# Patient Record
Sex: Female | Born: 1950 | Race: White | Hispanic: No | Marital: Married
Health system: Midwestern US, Academic
[De-identification: ages and names within clinical notes are randomized; demographics above are authoritative.]

---

## 2016-12-08 ENCOUNTER — Encounter: Admit: 2016-12-08 | Discharge: 2016-12-08 | Payer: MEDICARE

## 2016-12-08 DIAGNOSIS — Z780 Asymptomatic menopausal state: ICD-10-CM

## 2016-12-08 DIAGNOSIS — E669 Obesity, unspecified: ICD-10-CM

## 2016-12-08 DIAGNOSIS — C50411 Malignant neoplasm of upper-outer quadrant of right female breast: ICD-10-CM

## 2016-12-08 DIAGNOSIS — Z853 Personal history of malignant neoplasm of breast: ICD-10-CM

## 2016-12-08 DIAGNOSIS — R7611 Nonspecific reaction to tuberculin skin test without active tuberculosis: ICD-10-CM

## 2016-12-08 DIAGNOSIS — Z1231 Encounter for screening mammogram for malignant neoplasm of breast: Principal | ICD-10-CM

## 2016-12-08 DIAGNOSIS — Z86718 Personal history of other venous thrombosis and embolism: ICD-10-CM

## 2016-12-08 DIAGNOSIS — E785 Hyperlipidemia, unspecified: ICD-10-CM

## 2016-12-08 DIAGNOSIS — M1990 Unspecified osteoarthritis, unspecified site: Principal | ICD-10-CM

## 2016-12-08 DIAGNOSIS — F419 Anxiety disorder, unspecified: ICD-10-CM

## 2016-12-08 DIAGNOSIS — Z7901 Long term (current) use of anticoagulants: ICD-10-CM

## 2016-12-08 DIAGNOSIS — Z9221 Personal history of antineoplastic chemotherapy: ICD-10-CM

## 2016-12-08 DIAGNOSIS — Z13820 Encounter for screening for osteoporosis: ICD-10-CM

## 2016-12-08 DIAGNOSIS — C779 Secondary and unspecified malignant neoplasm of lymph node, unspecified: ICD-10-CM

## 2016-12-08 DIAGNOSIS — Z923 Personal history of irradiation: ICD-10-CM

## 2016-12-08 DIAGNOSIS — Z170 Estrogen receptor positive status [ER+]: ICD-10-CM

## 2016-12-08 DIAGNOSIS — Z79811 Long term (current) use of aromatase inhibitors: ICD-10-CM

## 2016-12-08 DIAGNOSIS — M8580 Other specified disorders of bone density and structure, unspecified site: ICD-10-CM

## 2016-12-08 DIAGNOSIS — Z9189 Other specified personal risk factors, not elsewhere classified: ICD-10-CM

## 2016-12-08 DIAGNOSIS — R5383 Other fatigue: ICD-10-CM

## 2016-12-08 DIAGNOSIS — Z9011 Acquired absence of right breast and nipple: ICD-10-CM

## 2016-12-08 DIAGNOSIS — I10 Essential (primary) hypertension: ICD-10-CM

## 2016-12-08 DIAGNOSIS — H409 Unspecified glaucoma: ICD-10-CM

## 2016-12-08 LAB — CBC AND DIFF
Lab: 0.1 10*3/uL (ref 0–0.20)
Lab: 4.4 M/UL (ref 4.0–5.0)
Lab: 7.4 K/UL (ref 4.5–11.0)

## 2016-12-08 LAB — COMPREHENSIVE METABOLIC PANEL
Lab: 0.7 mg/dL (ref 0.4–1.00)
Lab: 102 MMOL/L (ref 98–110)
Lab: 137 MMOL/L (ref 137–147)
Lab: 15 mg/dL (ref >60)
Lab: 17 U/L (ref 7–56)
Lab: 29 MMOL/L (ref 21–30)
Lab: 3.7 MMOL/L — ABNORMAL HIGH (ref 3.5–5.1)
Lab: 6 K/UL (ref 3–12)
Lab: 9.8 mg/dL (ref 8.5–10.6)
Lab: 99 mg/dL (ref >60)
Lab: >60 mL/min (ref >60)
Lab: >60 mL/min (ref >60)

## 2016-12-09 ENCOUNTER — Encounter: Admit: 2016-12-09 | Discharge: 2016-12-09 | Payer: MEDICARE

## 2016-12-09 NOTE — Progress Notes
Patient called back to schedule appointment with Kasandra Knudsen, APRN 02/20/17 9:00 for genetic testing.   Paperwork mailed.

## 2017-05-10 ENCOUNTER — Encounter: Admit: 2017-05-10 | Discharge: 2017-05-10 | Payer: MEDICARE

## 2017-05-10 DIAGNOSIS — F419 Anxiety disorder, unspecified: ICD-10-CM

## 2017-05-10 DIAGNOSIS — H409 Unspecified glaucoma: ICD-10-CM

## 2017-05-10 DIAGNOSIS — Z853 Personal history of malignant neoplasm of breast: ICD-10-CM

## 2017-05-10 DIAGNOSIS — R7611 Nonspecific reaction to tuberculin skin test without active tuberculosis: ICD-10-CM

## 2017-05-10 DIAGNOSIS — Z8041 Family history of malignant neoplasm of ovary: ICD-10-CM

## 2017-05-10 DIAGNOSIS — E785 Hyperlipidemia, unspecified: ICD-10-CM

## 2017-05-10 DIAGNOSIS — R5383 Other fatigue: ICD-10-CM

## 2017-05-10 DIAGNOSIS — M1990 Unspecified osteoarthritis, unspecified site: Principal | ICD-10-CM

## 2017-05-10 DIAGNOSIS — Z1379 Encounter for other screening for genetic and chromosomal anomalies: Principal | ICD-10-CM

## 2017-05-10 DIAGNOSIS — I10 Essential (primary) hypertension: ICD-10-CM

## 2017-05-20 ENCOUNTER — Encounter: Admit: 2017-05-20 | Discharge: 2017-05-20 | Payer: MEDICARE

## 2017-05-23 ENCOUNTER — Encounter: Admit: 2017-05-23 | Discharge: 2017-05-23 | Payer: MEDICARE

## 2017-05-23 DIAGNOSIS — Z1379 Encounter for other screening for genetic and chromosomal anomalies: ICD-10-CM

## 2017-05-23 DIAGNOSIS — H409 Unspecified glaucoma: ICD-10-CM

## 2017-05-23 DIAGNOSIS — I10 Essential (primary) hypertension: ICD-10-CM

## 2017-05-23 DIAGNOSIS — E785 Hyperlipidemia, unspecified: ICD-10-CM

## 2017-05-23 DIAGNOSIS — R5383 Other fatigue: ICD-10-CM

## 2017-05-23 DIAGNOSIS — M1990 Unspecified osteoarthritis, unspecified site: Principal | ICD-10-CM

## 2017-05-23 DIAGNOSIS — Z853 Personal history of malignant neoplasm of breast: ICD-10-CM

## 2017-05-23 DIAGNOSIS — F419 Anxiety disorder, unspecified: ICD-10-CM

## 2017-05-23 DIAGNOSIS — R7611 Nonspecific reaction to tuberculin skin test without active tuberculosis: ICD-10-CM

## 2017-05-23 MED ORDER — LETROZOLE 2.5 MG PO TAB
ORAL_TABLET | Freq: Every day | ORAL | 3 refills | 16.50000 days | Status: AC
Start: 2017-05-23 — End: 2018-05-15

## 2017-05-25 ENCOUNTER — Encounter: Admit: 2017-05-25 | Discharge: 2017-05-25 | Payer: MEDICARE

## 2017-05-26 ENCOUNTER — Encounter: Admit: 2017-05-26 | Discharge: 2017-05-26 | Payer: MEDICARE

## 2017-05-26 DIAGNOSIS — Z803 Family history of malignant neoplasm of breast: Principal | ICD-10-CM

## 2017-12-14 ENCOUNTER — Encounter: Admit: 2017-12-14 | Discharge: 2017-12-14 | Payer: MEDICARE

## 2017-12-14 ENCOUNTER — Encounter: Admit: 2017-12-14 | Discharge: 2017-12-15 | Payer: MEDICARE

## 2017-12-14 DIAGNOSIS — Z853 Personal history of malignant neoplasm of breast: ICD-10-CM

## 2017-12-14 DIAGNOSIS — D126 Benign neoplasm of colon, unspecified: ICD-10-CM

## 2017-12-14 DIAGNOSIS — Z1211 Encounter for screening for malignant neoplasm of colon: ICD-10-CM

## 2017-12-14 DIAGNOSIS — Z86718 Personal history of other venous thrombosis and embolism: ICD-10-CM

## 2017-12-14 DIAGNOSIS — Z79811 Long term (current) use of aromatase inhibitors: ICD-10-CM

## 2017-12-14 DIAGNOSIS — M8580 Other specified disorders of bone density and structure, unspecified site: ICD-10-CM

## 2017-12-14 DIAGNOSIS — Z9011 Acquired absence of right breast and nipple: ICD-10-CM

## 2017-12-14 DIAGNOSIS — Z9221 Personal history of antineoplastic chemotherapy: ICD-10-CM

## 2017-12-14 DIAGNOSIS — Z1231 Encounter for screening mammogram for malignant neoplasm of breast: Principal | ICD-10-CM

## 2017-12-14 DIAGNOSIS — M5127 Other intervertebral disc displacement, lumbosacral region: ICD-10-CM

## 2017-12-14 DIAGNOSIS — Z923 Personal history of irradiation: ICD-10-CM

## 2017-12-14 DIAGNOSIS — C50411 Malignant neoplasm of upper-outer quadrant of right female breast: ICD-10-CM

## 2017-12-14 DIAGNOSIS — Z1379 Encounter for other screening for genetic and chromosomal anomalies: ICD-10-CM

## 2017-12-14 DIAGNOSIS — M1990 Unspecified osteoarthritis, unspecified site: Principal | ICD-10-CM

## 2017-12-14 DIAGNOSIS — Z13820 Encounter for screening for osteoporosis: ICD-10-CM

## 2017-12-14 DIAGNOSIS — Z170 Estrogen receptor positive status [ER+]: ICD-10-CM

## 2017-12-14 DIAGNOSIS — F419 Anxiety disorder, unspecified: ICD-10-CM

## 2017-12-14 DIAGNOSIS — I10 Essential (primary) hypertension: ICD-10-CM

## 2017-12-14 DIAGNOSIS — R7611 Nonspecific reaction to tuberculin skin test without active tuberculosis: ICD-10-CM

## 2017-12-14 DIAGNOSIS — R5383 Other fatigue: ICD-10-CM

## 2017-12-14 DIAGNOSIS — H409 Unspecified glaucoma: ICD-10-CM

## 2017-12-14 DIAGNOSIS — E785 Hyperlipidemia, unspecified: ICD-10-CM

## 2017-12-14 LAB — COMPREHENSIVE METABOLIC PANEL
Lab: 0.7 mg/dL (ref 0.4–1.00)
Lab: 10 mg/dL (ref 8.5–10.6)
Lab: 103 MMOL/L (ref 98–110)
Lab: 136 MMOL/L — ABNORMAL LOW (ref 137–147)
Lab: 20 mg/dL (ref 7–25)
Lab: 4.2 MMOL/L (ref 3.5–5.1)
Lab: 7 g/dL (ref 6.0–8.0)
Lab: 98 mg/dL (ref 70–100)

## 2017-12-14 LAB — CBC AND DIFF
Lab: 4.4 M/UL (ref 4.0–5.0)
Lab: 7.9 10*3/uL (ref 4.5–11.0)

## 2018-05-14 ENCOUNTER — Encounter: Admit: 2018-05-14 | Discharge: 2018-05-14 | Payer: MEDICARE

## 2018-05-15 MED ORDER — LETROZOLE 2.5 MG PO TAB
ORAL_TABLET | Freq: Every day | ORAL | 0 refills | 16.50000 days | Status: AC
Start: 2018-05-15 — End: 2018-08-21

## 2018-08-18 ENCOUNTER — Encounter: Admit: 2018-08-18 | Discharge: 2018-08-18 | Payer: MEDICARE

## 2018-08-21 MED ORDER — LETROZOLE 2.5 MG PO TAB
ORAL_TABLET | Freq: Every day | ORAL | 3 refills | 16.50000 days | Status: AC
Start: 2018-08-21 — End: ?

## 2018-12-19 NOTE — Progress Notes
Name: Tiffany Burton          MRN: [id number]      DOB: December 13, 1950      AGE: 68 y.o.   DATE OF SERVICE: 12/20/2018    Subjective:             Reason for Visit: Routine surveillance.     Breast Problem    Tiffany Burton is a 68 y.o. female     DIAGNOSIS: Right breast CA, 10/30/2008 (age 2)    STAGE: cT2N1M0, Stage IIB; ER+ PR+ HER-2/neu negative; Grade 3. Post neoadjuvant pathologic staging ypT1aN1(mic)M0    TREATMENT SUMMARY:  Ms. Strebe is a 68 year old postmenopausal female who underwent rountine screening mammogram on 10/21/2008 at [facility] which demonstrated a spiculated density in the UOQ of the right breast (BIRADS Category 0). Additional imaging was recommended and the patient underwent a diagnostic mammogram of the right breast with ultrasound which were read as BIRADS Category 4. These imaging studies revealed a 10 o'clock lesion in the right breast measuring 1.6 x 1.3 x 1.3cm. CNBX was performed on 10/27/2008 and demonstrated invasive ductal carcinoma, Grade 3, ER 100%, PR 22%, Ki-67 3%, HER-2/neu 0+ by IHC, p53 0%, EGFR 0%. She then sought an opinion with Dr. Aram Beecham at [facility] and repeat breast imaging was performed. Ultrasound on 11/10/2008 revealed an increase in the right breast mass 10 o'clock position 7cm FTN measuring 2.5 x 2.7 x 1.8cm. In addition there was a suspicious appearing right axillary lymph node identified measuring 0.9 x 1.8 x 0.7cm. Biopsy of the lymph node 11/10/2008 demonstrated metastatic carcinoma, consistent with ductal carcinoma of the breast with predictive markers of ER 99%, PR 15%, Ki-67 7%, HER-2/neu 0+ by IHC and FISH 1.0. MRI was also performed on 11/10/2008 and the right breast carcinoma measured 2.5 x 3.5 x 3.9 cm. The patient had a portacath placed by Dr. Fredricka Bonine 11/20/2008.   ?  Neoadjuvant chemotherapy was recommended; the patient completed Epirubicin/Cyclophosphamide x 4 cycles (11/20/2008 - 12/31/2008) and Taxotere 75mg /m2 every 3 weeks (01/14/2009- 03/18/2009).   She underwent right modified radical mastectomy 04/14/2009 Fredricka Bonine). Pathology showed residual  0.2cm, grade 2, IDC with ALND (6/15) positive lymph nodes.  Prognostic markers on mastectomy: ER 100%, PR 0%, HER-2 0+, Ki-67 35%. Prognostic markers on LN: ER 100%, PR 0%, HER-2 0+, Ki-67 1%. Right chest wall radiation (Dr. Joycelyn Man) completed 07/16/2009. She then started antihormone therapy with femara (06/2009) and received bisphosphonate therapy with Zometa 07/17/2009 and completed 2 years of therapy (09/07/2011).  Port removed on 06/15/10.  ?  PRESENT THERAPY:  Currently on letrozole (06/2009); will complete in 10 years (06/2019)  ?  Medical Team:   Surgeon: Aram Beecham, MD  [facility]  [address] [place], [place] [address]  [phone]  ?  Medical oncologist: Lowella Petties, MD  [facility]  [address] [place], [place] [address]  [phone]  ?  Radiation oncologist: Joycelyn Man, MD  [facility]  [address]  [place], [place] [address]  [phone]    Family History   Problem Relation Age of Onset   ? Dementia Father    ? COPD Father    ? Coronary Artery Disease Mother    ? Glaucoma Mother    ? Cancer-Pancreas Paternal Uncle 34   ? Cancer Paternal Grandmother  unknown primary-tumor between spine & esophagus   ? Cancer-Hematologic Paternal Grandmother         lymphoma   ? Cancer-Ovarian Paternal Aunt 32   ? Cancer-Lung Paternal Uncle    ? Cancer-Prostate Brother 58   ? None Reported Son    ? None Reported Daughter    ? None Reported Sister         Lives in [place]       GENETIC TESTING:   [facility] testing: Common Hereditary Cancers Panel 05/10/2017: VUS RAD50 c.1336A>G (p.Lys446Glu)    HISTORY OF PRESENT ILLNESS:   The patient returns to the clinic today for continued follow-up in the Breast Cancer Survivorship Clinic. She has a history of right breast CA, 10/30/2008 (57). Stage IIB; ER+ PR+ HER-2/neu negative; Grade 3. Post neoadjuvant pathologic staging ypT1aN1(mic)M0. Post mastectomy radiation therapy (Dr. Joycelyn Man) completed 07/16/2009. She then started antihormone therapy with femara (06/2009) and received bisphosphonate therapy with Zometa 07/17/2009 and completed 2 years of therapy (09/07/2011).  Port removed on 06/15/10. Continues on letrozole (06/2009); will complete in 10 years (06/2019). She is feeling well, denies changes on the right chest wall, denies left breast changes.       Review of Systems   Significant fatigue is a chronic problem.   Now established with Fredia Sorrow, MD (PCP); [place], [place].   Her husband Ree Kida) was diagnosed with A-FIB and had (cardiac ablation in 06/2015; Dr. Wallene Huh, cardiology [facility])  Cardioversion in 07/2015. Last week he had another tachycardic event treated with medication change Nickolas Madrid, MD @ [facility]). 07/2016; cardiac ablation x 2. Holter monitor x one week. 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   Takes Femara at bedtime; no missed doses.   [facility] testing: Common Hereditary Cancers Panel 05/10/2017: VUS RAD50 c.1336A>G (p.Lys446Glu)  Significant right hip pain aching, burning pain down the right leg and numbness resolved with steroid injection (08/2014).   Weight (BMI 36.47; # 190 <- 36.31; # 194 <- 31.55 <- 33.52 <- 34.0 <- 35.3 <- 34.4 <- 34.12 <- 34.29 <- 34.30).   No SOB, cough or swelling of the extremities.   No chest pain or palpitations.  No changes in the right chest wall. Denies left breast changes.   Morning stiffness consistent with her AI therapy has improved but still present with prolonged sitting.   No weakness. No skin rash.   No nausea, vomiting, diarrhea, constipation or abdominal pain.  Colonoscopy 2014 (tubular adenoma). Repeat 03/2018; polyp high grade dysplasia. Repeat recommended 03/2019. No FH colon CA (father did have polyps).    No dysuria or hematuria. Occasional hematochezia (hemmorhoid).   Frequent urination.  Post menopausal; uterus and ovaries intact. No bleeding.   Occasional hot flashes; tolerable. No vaginal dryness.   Has not restarted YOGA, enjoys reading so she does not exercise much.    No depression or anxiety.       Objective:         ? ACETAMINOPHEN (TYLENOL PO) Take  by mouth as Needed.   ? amlodipine (NORVASC) 10 mg tablet Take 10 mg by mouth every morning.   ? brimonidine(+) (ALPHAGAN) 0.2 % OP ophthalmic solution Apply 1 Drop to both eyes Twice Daily.   ? CALCIUM CITRATE/VITAMIN D3 (CALCIUM CITRATE + D PO) Take 630 mg by mouth twice daily.   ? IBUPROFEN PO Take  by mouth as Needed.   ? latanoprost (XALATAN) 0.005 % ophthalmic solution Place 1 Drop into or  around eye(s) At Bedtime Daily.   ? letrozole [facility]) 2.5 mg tablet Take 1 tablet by mouth once daily   ? loratadine (CLARITIN) 10 mg PO tablet Take 10 mg by mouth daily.   ? MULTIVITAMIN PO Take  by mouth.   ? paroxetine (PAXIL) 20 mg tablet Take 20 mg by mouth at bedtime daily.   ? simvastatin (ZOCOR) 40 mg tablet Take 40 mg by mouth at bedtime daily.   ? timolol (TIMOPTIC) 0.5 % OP ophthalmic solution Apply 1 Drop to both eyes Twice Daily.     Vitals:    12/20/18 1327   BP: (!) 141/84   BP Source: Arm, Left Upper   Patient Position: Sitting   Pulse: 90   Resp: 16   Temp: 36.4 ?C (97.6 ?F)   TempSrc: Temporal   SpO2: 100%   Weight: 86.2 kg (190 lb)   Height: 155.9 cm (61.37)   PainSc: Three     Body mass index is 35.47 kg/m?Marland Kitchen     Pain Score: Three  Pain Loc: Knee  Pain Addressed:  N/A     Fatigue Scale:0    Patient Evaluated for a Clinical Trial: No treatment clinical trial available for this patient.     [place] Cooperative Oncology Group performance status is 0, Fully active, able to carry on all pre-disease performance without restriction.Marland Kitchen     Physical Exam  Vitals signs reviewed.   Chest:       Lymphadenopathy: Cervical: No cervical adenopathy.      Upper Body:      Right upper body: No supraclavicular adenopathy.      Left upper body: No supraclavicular adenopathy.     This is a 68 year old female in no acute distress; well-developed, well-nourished.   [facility] testing: Common Hereditary Cancers Panel 05/10/2017: VUS RAD50 c.1336A>G (p.Lys446Glu)  HEENT: No icterus  Neck: No JVD, supple.   Chest: CTA bilaterally.   CV: RRR without murmur.   Abdomen: Soft, non-distended, non-tender, positive bowel sounds, no organomegaly.    Skin: No rash.   Back: No tenderness with palpation or percussion over the spine.   Extremitites: No clubbing, cyanosis or edema.   CN: II-XII grossly intact; no sensory or motor abnormalities.    CBC w diff    Lab Results   Component Value Date/Time    WBC 7.0 12/20/2018 11:07 AM    RBC 4.29 12/20/2018 11:07 AM    HGB 13.2 12/20/2018 11:07 AM    HCT 39.2 12/20/2018 11:07 AM    MCV 91.4 12/20/2018 11:07 AM    MCH 30.8 12/20/2018 11:07 AM    MCHC 33.7 12/20/2018 11:07 AM    RDW 13.6 12/20/2018 11:07 AM    PLTCT 293 12/20/2018 11:07 AM    MPV 7.7 12/20/2018 11:07 AM    Lab Results   Component Value Date/Time    NEUT 73 12/20/2018 11:07 AM    ANC 5.10 12/20/2018 11:07 AM    LYMA 17 (L) 12/20/2018 11:07 AM    ALC 1.20 12/20/2018 11:07 AM    MONA 8 12/20/2018 11:07 AM    AMC 0.50 12/20/2018 11:07 AM    EOSA 1 12/20/2018 11:07 AM    AEC 0.10 12/20/2018 11:07 AM    BASA 1 12/20/2018 11:07 AM    ABC 0.10 12/20/2018 11:07 AM        Comprehensive Metabolic Profile    Lab Results   Component Value Date/Time    NA 138 12/20/2018 11:07 AM  K 4.1 12/20/2018 11:07 AM    CL 101 12/20/2018 11:07 AM    CO2 30 12/20/2018 11:07 AM    GAP 7 12/20/2018 11:07 AM    BUN 16 12/20/2018 11:07 AM    CR 0.66 12/20/2018 11:07 AM    GLU 104 (H) 12/20/2018 11:07 AM    Lab Results   Component Value Date/Time    CA 10.1 12/20/2018 11:07 AM    ALBUMIN 4.1 12/20/2018 11:07 AM    TOTPROT 7.0 12/20/2018 11:07 AM ALKPHOS 69 12/20/2018 11:07 AM    AST 17 12/20/2018 11:07 AM    ALT 21 12/20/2018 11:07 AM    TOTBILI 0.5 12/20/2018 11:07 AM    GFR >60 12/20/2018 11:07 AM    GFRAA >60 12/20/2018 11:07 AM        Lab Results   Component Value Date/Time    CA2729 26.1 06/01/2015 01:05 PM     Lab [facility] 12/02/2015:   CBC/chemistry: Glucose 121  Otherwise within normal limits       SURGICAL PATHOLOGY 04/14/2009:   Final Diagnosis:  A. 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    B. Lymph node (1), sentinel lymph node, biopsy:  Metastatic carcinoma, consistent with metastatic ductal carcinoma (1/1) evident on pan-cytokeratin staining.    C. Lymph node (1), sentinel lymph node, biopsy:   Metastatic carcinoma, consistent with metastatic ductal carcinoma (1/1).    D. Lymph nodes (3), sentinel lymph node, biopsy:   There is no evidence of malignancy (0/3).  The diagnosis is supported by negative immunohistochemical staining for pan-cytokeratin.    E. Fibroadipose tissue, additional level 1 right axillary tissue, biopsy:  Benign soft tissue.  There is no evidence of malignancy.     F. Fibroadipose tissue, additional level 2 right axillary, biopsy:  Benign soft tissue.  There is no evidence of malignancy.     G. Breast tissue and lymph nodes (7), right breast with level 1 and 2 axillary dissection, mastectomy:   Residual invasive ductal carcinoma, nuclear grade 2. See comment.  Metastatic carcinoma involving two lymph nodes (2/7).     Comment:  INVASIVE CARCINOMA OF THE BREAST STATUS POST NEOADJUVANT THERAPY  Specimen Type: Mastectomy   Laterality: Right  Tumor Site: Upper outer quadrant   Tumor Bed Identified: Yes  Size/Extent of Tumor Bed: 3.6 x 3.4 x 2.0 cm  Size/Extent of Residual Invasive Tumor: 0.2cm in aggregate dimension  Average Residual Viable Cancer Cellularity of the Tumor Bed: 2% Histologic Type: Invasive ductal carcinoma  Histologic Grade (Nottingham Histologic Score): moderate differentiated,  II/III  Tubule Formation: 3  Nuclear Grade: 2  Mitotic Count (40x objective): 1  Total Nottingham Score: 6/9  Surgical Margins: 1.0cm from closest margin (deep margin)   Ductal Carcinoma In-situ (DCIS): Absent  Lobular Carcinoma In-situ (LCIS): Absent  Lymph-Vascular Invasion: Identified   Perineural Invasion: Not identified  Tumor Necrosis: Not identified  Nipple Involvement: Not identified  Skin Involvement: Not identified  Lymph Node Sampling: sentinel lymph node with axillary dissection  Total number of involved nodes/total nodes found: 6/15   Size of largest metastasis: 0.1cm  Extranodal extension: Not identified  Non-neoplastic Breast Tissue: Fibrosis  Overall Response to Neoadjuvant Therapy:  In the Breast: Partial  In the Lymph Nodes: Partial  Prognostic markers: See Chartmaxx for addendum Image Analysis report  Time between tumor removal and placement into formalin < 1 hour: Yes  Fixation Time between  6-48 hours: Yes  Pathologic Staging:   ypT1a, N35mi    ?  SCANS:   CT CHEST/ABDOMEN 12/02/2009:   CHEST:   1. STATUS POST RIGHT MASTECTOMY AND AXILLARY LYMPH NODE DISSECTION WITHOUT EVIDENCE OF PULMONARY METASTATIC DISEASE OR THORACIC LYMPHADENOPATHY.  ABDOMEN AND PELVIS:  1. NO EVIDENCE OF HEPATIC METASTATIC DISEASE OR ABDOMINOPELVIC LYMPHADENOPATHY.   BONE SCAN 12/02/2009:  NO SCINTIGRAPHIC EVIDENCE OF OSSEOUS METASTATIC DISEASE   BONE SCAN 12/01/2010:  NO SCINTIGRAPHIC EVIDENCE OF OSSEOUS METASTATIC DISEASE.   12/14/2011 BONE SCAN:   DEGENERATIVE CHANGES OF THE AXIAL AND APPENDICULAR SKELETON AS DESCRIBED WITHOUT EVIDENCE OF OSSEOUS METASTASIS.   12/01/2010 CHEST X-RAY:  STATUS POST RIGHT MASTECTOMY AND AXILLARY LYMPH NODE DISSECTION WITHOUT   EVIDENCE OF ACUTE DISEASE IN THE CHEST.   12/14/2011 CHEST X-RAY:   NO ACUTE DISEASE IN THE CHEST.   ?  BREAST IMAGING: 12/09/2013 LEFT MAMMOGRAM: ASSESSMENT: BIRAD 1-NEGATIVE  12/22/2014 LEFT MAMMOGRAM:  ASSESSMENT: BIRAD 1 - NEGATIVE  12/07/2015 LEFT MAMMOGRAM: ACR BI-RADS? Assessments: BIRAD 1-Negative  12/08/2016 LEFT MAMMOGRAM:  ACR BI-RADS? Assessments: BIRAD 1-Negative  12/14/2017 LEFT MAMMOGRAM: ACR BI-RADS? Assessments: BIRAD 1-Negative  12/20/2018 LEFT MAMMOGRAM:  ASSESSMENT: BIRAD 1-Negative     CBC w diff    Lab Results   Component Value Date/Time    WBC 7.0 12/20/2018 11:07 AM    RBC 4.29 12/20/2018 11:07 AM    HGB 13.2 12/20/2018 11:07 AM    HCT 39.2 12/20/2018 11:07 AM    MCV 91.4 12/20/2018 11:07 AM    MCH 30.8 12/20/2018 11:07 AM    MCHC 33.7 12/20/2018 11:07 AM    RDW 13.6 12/20/2018 11:07 AM    PLTCT 293 12/20/2018 11:07 AM    MPV 7.7 12/20/2018 11:07 AM    Lab Results   Component Value Date/Time    NEUT 73 12/20/2018 11:07 AM    ANC 5.10 12/20/2018 11:07 AM    LYMA 17 (L) 12/20/2018 11:07 AM    ALC 1.20 12/20/2018 11:07 AM    MONA 8 12/20/2018 11:07 AM    AMC 0.50 12/20/2018 11:07 AM    EOSA 1 12/20/2018 11:07 AM    AEC 0.10 12/20/2018 11:07 AM    BASA 1 12/20/2018 11:07 AM    ABC 0.10 12/20/2018 11:07 AM        Comprehensive Metabolic Profile    Lab Results   Component Value Date/Time    NA 138 12/20/2018 11:07 AM    K 4.1 12/20/2018 11:07 AM    CL 101 12/20/2018 11:07 AM    CO2 30 12/20/2018 11:07 AM    GAP 7 12/20/2018 11:07 AM    BUN 16 12/20/2018 11:07 AM    CR 0.66 12/20/2018 11:07 AM    GLU 104 (H) 12/20/2018 11:07 AM    Lab Results   Component Value Date/Time    CA 10.1 12/20/2018 11:07 AM    ALBUMIN 4.1 12/20/2018 11:07 AM    TOTPROT 7.0 12/20/2018 11:07 AM    ALKPHOS 69 12/20/2018 11:07 AM    AST 17 12/20/2018 11:07 AM    ALT 21 12/20/2018 11:07 AM    TOTBILI 0.5 12/20/2018 11:07 AM    GFR >60 12/20/2018 11:07 AM    GFRAA >60 12/20/2018 11:07 AM           BONE HEALTH:   06/01/2015 BMD:  Improvement since 2016 evaluation; mild osteopenia.   12/14/2017 BMD: Stable bone mineral density with persistent low bone mass (osteopenia).   ?  ASSESSMENT AND PLAN:   1. Right breast CA, 10/2008. Clinical stage T2N1M0; IIB (ER/PR positive, HER-2 negative Grade 3, low PR probable luminal B). Completed 4 cycles of EC followed by 4 cycles of taxotere eoadjuvantly and underwent modified radical mastectomy 04/14/2009 with pathologic staging ypT1aN1(mic)M0.She has completed right chest wall radiation therapy under Dr. Darcus Austin on 07/16/2009. Now on Femara since 07/17/09. Patient is tolerating endocrine therapy well. Will continue 10 years of AI therapy if tolerated (06/2019).  Will avoid tamoxifen given intact uterus and personal history of DVT.   2. History of right upper extremetry DVT:  She was initially on warfarin and managed by PCP in [place], [place]. She has completed anti-coagulation therapy. No further problems.   3. Patient completed 2 years of  Zometa 08/2011. BMD 05/2013; stable mild osteopenia. Repeat 06/23/2014 showed moderate osteopenia. Repeat 05/2015; improvement to mild osteopenia. Repeat 12/14/2017; stable osteopenia.   4. Reviewed signs to watch for that could indicate a local or distant recurrence. Patient will call our office with any new signs.   Local recurrence  Signs and symptoms of local recurrence following mastectomy/reconstruction may include:   ? One or more painless nodules on or under the skin of your chest wall   ? A new area of thickening along or near the mastectomy scar  Regional recurrence   A regional breast cancer recurrence means the cancer has come back in the lymph nodes in your armpit or collarbone area. Signs and symptoms of regional recurrence may include:   ? A lump or swelling in the lymph nodes under your arm or in the groove above your collarbone   ? Swelling of your arm (this could be related to lymphedema or even a blood clot)  ? Persistent pain in your arm and shoulder   ? Increasing loss of sensation in your arm and hand  Distant (metastatic) recurrence A distant, or metastatic, recurrence means the cancer has traveled to distant parts of the body, most commonly the bones, liver and lungs. The signs and symptoms may include:   ? Pain, such as chest or bone pain   ? Persistent, dry cough   ? Difficulty breathing   ? Loss of appetite   ? Persistent nausea, vomiting or weight loss   ? Swelling in the abdomen  ? Severe headaches  When to call our office   You know your body best ? what feels normal and what doesn't. It's important to be aware of the signs and symptoms of recurrent breast cancer, such as:   ? New and persistent pain   ? Changes or new lumps in your breast or surgical scar or chest wall   ? Weight loss   ? Shortness of breath  If you experience any signs and symptoms that might suggest a recurrence, call our office. Patient verbalized understanding and phone numbers provided.    5. History of glaucoma. Dr. Mignon Pine [place], [place]).  6. Genetic testing: [facility] testing: Common Hereditary Cancers Panel 05/10/2017: VUS RAD50 c.1336A>G (p.Lys446Glu)  7. RTC 12 months with lab, BMD and left screening mammogram.      I have spent 30 minutes with the patient today. 25 minutes spent face to face in evaluation, management, counseling and coordination of care functions.   Collaborating physician: Joni Reining, MD  NPI # [id number]      Ernie Hew, APRN

## 2018-12-20 ENCOUNTER — Encounter: Admit: 2018-12-20 | Discharge: 2018-12-20 | Payer: MEDICARE

## 2018-12-20 LAB — CBC AND DIFF
Lab: 13 g/dL (ref 12.0–15.0)
Lab: 39 % (ref 36–45)
Lab: 4.2 M/UL (ref 4.0–5.0)
Lab: 7 10*3/uL (ref 4.5–11.0)

## 2018-12-20 LAB — COMPREHENSIVE METABOLIC PANEL
Lab: 0.5 mg/dL (ref 0.3–1.2)
Lab: 0.6 mg/dL (ref 0.4–1.00)
Lab: 10 mg/dL (ref 8.5–10.6)
Lab: 101 MMOL/L (ref 98–110)
Lab: 104 mg/dL — ABNORMAL HIGH (ref 70–100)
Lab: 138 MMOL/L (ref 137–147)
Lab: 16 mg/dL (ref 7–25)
Lab: 17 U/L (ref 7–40)
Lab: 21 U/L (ref 7–56)
Lab: 30 MMOL/L (ref 21–30)
Lab: 4.1 MMOL/L (ref 3.5–5.1)
Lab: 4.1 g/dL (ref 3.5–5.0)
Lab: 69 U/L (ref 25–110)
Lab: 7 g/dL — ABNORMAL LOW (ref 6.0–8.0)

## 2018-12-20 NOTE — Patient Instructions
Thank you for coming to see us today.   Please call our office or send a message through MyChart if you have any questions or concerns.                                                                                                                                                           Jaydan Meidinger RN, BSN, OCN, CBCN, CN-BN  Clinical Nurse Coordinator for Lori Ranallo, APRN-BC        Breast Cancer Survivorship/High Risk Breast Clinic  Lymphedema Screening & Prevention Program   [phone] (phone)  [phone] (fax)  [phone] (scheduling)  [email or web address]      [facility]   Clinic: Monday, Thursday, Friday   Admin: Tuesday  [address]  [place], [place] [address]   [facility]   Clinic: Wednesday  [address]  [place], [place] [address]       For up to date information on the COVID-19 virus, visit the CDC website. https://www.cdc.gov/coronavirus   General supportive care during cold and flu season and infection prevention reminders:    o Wash hands often with soap and water for at least 20 seconds   o Cover your mouth and nose   o Social distancing: try to maintain 6 feet between you and other people   o Stay home if sick and symptoms mild or manageable?   If you must be around people wear a mask     If you are having symptoms of a lower respiratory infection (cough, shortness of breath) and/or fever AND either traveled in last 30 days (internationally or to region of exposure) OR known exposure to patient with COVID19:     o Call your primary care provider for questions or health needs.    Tell your doctor about your recent travel and your symptoms     o In a medical emergency, call 911 or go to the nearest emergency room.

## 2019-08-11 ENCOUNTER — Encounter: Admit: 2019-08-11 | Discharge: 2019-08-11 | Payer: MEDICARE

## 2019-08-12 MED ORDER — LETROZOLE 2.5 MG PO TAB
ORAL_TABLET | Freq: Every day | 0 refills
Start: 2019-08-12 — End: ?

## 2019-12-20 NOTE — Progress Notes
Name: Tiffany Burton          MRN: [id number]      DOB: [birth year]      AGE: 69 y.o.   DATE OF SERVICE: 12/26/2019    Subjective:             Reason for Visit: Routine surveillance.     No chief complaint on file.    Tiffany Burton is a 69 y.o. female     DIAGNOSIS: Right breast CA, 10/30/2008 (age 70)    STAGE: cT2N1M0, Stage IIB; ER+ PR+ HER-2/neu negative; Grade 3. Post neoadjuvant pathologic staging ypT1aN1(mic)M0    TREATMENT SUMMARY:  Ms. Gerstle is a 69 year old postmenopausal female who underwent rountine screening mammogram on 10/21/2008 at [facility] which demonstrated a spiculated density in the UOQ of the right breast (BIRADS Category 0). Additional imaging was recommended and the patient underwent a diagnostic mammogram of the right breast with ultrasound which were read as BIRADS Category 4. These imaging studies revealed a 10 o'clock lesion in the right breast measuring 1.6 x 1.3 x 1.3cm. CNBX was performed on 10/27/2008 and demonstrated invasive ductal carcinoma, Grade 3, ER 100%, PR 22%, Ki-67 3%, HER-2/neu 0+ by IHC, p53 0%, EGFR 0%. She then sought an opinion with Dr. Aram Beecham at [facility] and repeat breast imaging was performed. Ultrasound on 11/10/2008 revealed an increase in the right breast mass 10 o'clock position 7cm FTN measuring 2.5 x 2.7 x 1.8cm. In addition there was a suspicious appearing right axillary lymph node identified measuring 0.9 x 1.8 x 0.7cm. Biopsy of the lymph node 11/10/2008 demonstrated metastatic carcinoma, consistent with ductal carcinoma of the breast with predictive markers of ER 99%, PR 15%, Ki-67 7%, HER-2/neu 0+ by IHC and FISH 1.0. MRI was also performed on 11/10/2008 and the right breast carcinoma measured 2.5 x 3.5 x 3.9 cm. The patient had a portacath placed by Dr. Fredricka Bonine 11/20/2008.   ?  Neoadjuvant chemotherapy was recommended; the patient completed Epirubicin/Cyclophosphamide x 4 cycles (11/20/2008 - 12/31/2008) and Taxotere 75mg /m2 every 3 weeks (01/14/2009- 03/18/2009).   She underwent right modified radical mastectomy 04/14/2009 Fredricka Bonine). Pathology showed residual  0.2cm, grade 2, IDC with ALND (6/15) positive lymph nodes.  Prognostic markers on mastectomy: ER 100%, PR 0%, HER-2 0+, Ki-67 35%. Prognostic markers on LN: ER 100%, PR 0%, HER-2 0+, Ki-67 1%. Right chest wall radiation (Dr. Joycelyn Man) completed 07/16/2009. She then started antihormone therapy with femara (06/2009) and received bisphosphonate therapy with Zometa 07/17/2009 and completed 2 years of therapy (09/07/2011).  Port removed on 06/15/10.  ?  PRESENT THERAPY:  Currently on letrozole (06/2009); will completed 07/2019.   ?  Medical Team:   Surgeon: Aram Beecham, MD  [facility]  [address] [place], [place] [address]  [phone]  ?  Medical oncologist: Lowella Petties, MD  [facility]  [address] [place], [place] [address]  [phone]  ?  Radiation oncologist: Joycelyn Man, MD  [facility]  [address]  [place], [place] [address]  [phone]    Family History   Problem Relation Age of Onset   ? Dementia Father    ? COPD Father    ? Coronary Artery Disease Mother    ? Glaucoma Mother    ? Cancer-Pancreas Paternal Uncle 28   ? Cancer Paternal Grandmother  unknown primary-tumor between spine & esophagus   ? Cancer-Hematologic Paternal Grandmother         lymphoma   ? Cancer-Ovarian Paternal Aunt 37   ? Cancer-Lung Paternal Uncle    ? Cancer-Prostate Brother 58   ? None Reported Son    ? None Reported Daughter    ? None Reported Sister         Lives in [place]       GENETIC TESTING:   [facility] testing: Common Hereditary Cancers Panel 05/10/2017: VUS RAD50 c.1336A>G (p.Lys446Glu)    HISTORY OF PRESENT ILLNESS:   The patient returns to the clinic today for continued follow-up in the Breast Cancer Survivorship Clinic. She has a history of right breast CA, 10/30/2008 (57). Stage IIB; ER+ PR+ HER-2/neu negative; Grade 3. Post neoadjuvant pathologic staging ypT1aN1(mic)M0. Post mastectomy radiation therapy (Dr. Joycelyn Man) completed 07/16/2009. She then started antihormone therapy with femara (06/2009) and received bisphosphonate therapy with Zometa 07/17/2009 and completed 2 years of therapy (09/07/2011).  Port removed on 06/15/10. Continues on letrozole (06/2009); will complete in 10 years (06/2019). She is feeling well, denies changes on the right chest wall, denies left breast changes.       Review of Systems   Significant fatigue is a chronic problem.   Now established with Fredia Sorrow, MD (PCP); [place], [place].   Her husband Ree Kida) was diagnosed with A-FIB and had (cardiac ablation in 06/2015; Dr. Wallene Huh, cardiology [facility])  Cardioversion in 07/2015. Last week he had another tachycardic event treated with medication change Nickolas Madrid, MD @ [facility]). 07/2016; cardiac ablation x 2. Holter monitor x one week. 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   Femara completed 07/2019; feels more energetic.   They are traveling again; being careful.   COVID vaccination completed (Moderna; 05/09/2019 and 06/06/2019).   [facility]ae testing: Common Hereditary Cancers Panel 05/10/2017: VUS RAD50 c.1336A>G (p.Lys446Glu)  Significant right hip pain aching, burning pain down the right leg and numbness resolved with steroid injection (08/2014).   Weight (BMI 35.31; # 189 <- 36.47; # 190 <- 36.31; # 194 <- 31.55 <- 33.52 <- 34.0 <- 35.3 <- 34.4 <- 34.12 <- 34.29 <- 34.30).   No SOB, cough or swelling of the extremities.   No chest pain or palpitations.  No changes in the right chest wall. Denies left breast changes.   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   No weakness. No skin rash.   No nausea, vomiting, diarrhea, constipation or abdominal pain.  Colonoscopy 2014 (tubular adenoma). Repeat 03/2018; polyp high grade dysplasia. Repeat recommended 03/2019.   She had a repeat colonoscopy 04/2019; small polyp. Repeat 3 years Marge Duncans).   No FH colon CA (father did have polyps).    No dysuria or hematuria. Occasional hematochezia (hemmorhoid).   Frequent urination.  Post menopausal; uterus and ovaries intact. No bleeding.   Occasional hot flashes; tolerable. No vaginal dryness.   Has not restarted YOGA, enjoys reading so she does not exercise much.    No depression or anxiety.       Objective:         ? ACETAMINOPHEN (TYLENOL PO) Take  by mouth as Needed.   ? amlodipine (NORVASC) 10 mg tablet Take 10 mg by mouth every morning.   ? brimonidine(+) (ALPHAGAN) 0.2 % OP ophthalmic solution Apply 1 Drop to both eyes Twice Daily.   ? CALCIUM CITRATE/VITAMIN D3 (CALCIUM CITRATE +  D PO) Take 630 mg by mouth twice daily.   ? IBUPROFEN PO Take  by mouth as Needed.   ? latanoprost (XALATAN) 0.005 % ophthalmic solution Place 1 Drop into or around eye(s) At Bedtime Daily.   ? loratadine (CLARITIN) 10 mg PO tablet Take 10 mg by mouth daily.   ? MULTIVITAMIN PO Take  by mouth.   ? paroxetine (PAXIL) 20 mg tablet Take 20 mg by mouth at bedtime daily.   ? simvastatin (ZOCOR) 40 mg tablet Take 40 mg by mouth at bedtime daily.   ? timolol (TIMOPTIC) 0.5 % OP ophthalmic solution Apply 1 Drop to both eyes Twice Daily.   ? walker medical supply .MEDSUPPLY     Vitals:    12/26/19 1304   BP: 130/75   BP Source: Arm, Left Upper   Patient Position: Sitting   Pulse: 83   Resp: 16   Temp: 36.3 ?C (97.4 ?F)   TempSrc: Oral   SpO2: 99%   Weight: 85.8 kg (189 lb 3.2 oz)   Height: 155.9 cm (61.38)   PainSc: Zero     Body mass index is 35.31 kg/m?Marland Kitchen        Pain Addressed:  N/A     Fatigue Scale:0    Patient Evaluated for a Clinical Trial: No treatment clinical trial available for this patient.     [place] Cooperative Oncology Group performance status is 0, Fully active, able to carry on all pre-disease performance without restriction.Marland Kitchen     Physical Exam  Vitals reviewed.   Chest:       Lymphadenopathy: Cervical: No cervical adenopathy.      Upper Body:      Right upper body: No supraclavicular adenopathy.      Left upper body: No supraclavicular adenopathy.     This is a 69 year old female in no acute distress; well-developed, well-nourished.   Inv[facility]ae testing: Common Hereditary Cancers Panel 05/10/2017: VUS RAD50 c.1336A>G (p.Lys446Glu)  HEENT: No icterus  Neck: No JVD, supple.   Chest: CTA bilaterally.   CV: RRR without murmur.   Abdomen: Soft, non-distended, non-tender, positive bowel sounds, no organomegaly.    Skin: No rash.   Back: No tenderness with palpation or percussion over the spine.   Extremitites: No clubbing, cyanosis or edema.   CN: II-XII grossly intact; no sensory or motor abnormalities.    CBC w diff    Lab Results   Component Value Date/Time    WBC 7.6 12/26/2019 11:01 AM    RBC 4.54 12/26/2019 11:01 AM    HGB 14.0 12/26/2019 11:01 AM    HCT 41.6 12/26/2019 11:01 AM    MCV 91.6 12/26/2019 11:01 AM    MCH 30.8 12/26/2019 11:01 AM    MCHC 33.7 12/26/2019 11:01 AM    RDW 13.9 12/26/2019 11:01 AM    PLTCT 277 12/26/2019 11:01 AM    MPV 7.7 12/26/2019 11:01 AM    Lab Results   Component Value Date/Time    NEUT 75 12/26/2019 11:01 AM    ANC 5.70 12/26/2019 11:01 AM    LYMA 14 (L) 12/26/2019 11:01 AM    ALC 1.10 12/26/2019 11:01 AM    MONA 9 12/26/2019 11:01 AM    AMC 0.70 12/26/2019 11:01 AM    EOSA 1 12/26/2019 11:01 AM    AEC 0.10 12/26/2019 11:01 AM    BASA 1 12/26/2019 11:01 AM    ABC 0.10 12/26/2019 11:01 AM        Comprehensive  Metabolic Profile    Lab Results   Component Value Date/Time    NA 141 12/26/2019 11:01 AM    K 3.3 (L) 12/26/2019 11:01 AM    CL 102 12/26/2019 11:01 AM    CO2 32 (H) 12/26/2019 11:01 AM    GAP 7 12/26/2019 11:01 AM    BUN 13 12/26/2019 11:01 AM    CR 0.79 12/26/2019 11:01 AM    GLU 84 12/26/2019 11:01 AM    Lab Results   Component Value Date/Time    CA 9.9 12/26/2019 11:01 AM    ALBUMIN 4.1 12/26/2019 11:01 AM    TOTPROT 7.0 12/26/2019 11:01 AM    ALKPHOS 72 12/26/2019 11:01 AM    AST 18 12/26/2019 11:01 AM    ALT 19 12/26/2019 11:01 AM    TOTBILI 0.5 12/26/2019 11:01 AM    GFR >60 12/26/2019 11:01 AM    GFRAA >60 12/26/2019 11:01 AM        Lab Results   Component Value Date/Time    CA2729 26.1 06/01/2015 01:05 PM     Lab [facility] 12/02/2015:   CBC/chemistry: Glucose 121  Otherwise within normal limits       SURGICAL PATHOLOGY 04/14/2009:   Final Diagnosis:  A. 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    B. Lymph node (1), sentinel lymph node, biopsy:  Metastatic carcinoma, consistent with metastatic ductal carcinoma (1/1) evident on pan-cytokeratin staining.    C. Lymph node (1), sentinel lymph node, biopsy:   Metastatic carcinoma, consistent with metastatic ductal carcinoma (1/1).    D. Lymph nodes (3), sentinel lymph node, biopsy:   There is no evidence of malignancy (0/3).  The diagnosis is supported by negative immunohistochemical staining for pan-cytokeratin.    E. Fibroadipose tissue, additional level 1 right axillary tissue, biopsy:  Benign soft tissue.  There is no evidence of malignancy.     F. Fibroadipose tissue, additional level 2 right axillary, biopsy:  Benign soft tissue.  There is no evidence of malignancy.     G. Breast tissue and lymph nodes (7), right breast with level 1 and 2 axillary dissection, mastectomy:   Residual invasive ductal carcinoma, nuclear grade 2. See comment.  Metastatic carcinoma involving two lymph nodes (2/7).     Comment:  INVASIVE CARCINOMA OF THE BREAST STATUS POST NEOADJUVANT THERAPY  Specimen Type: Mastectomy   Laterality: Right  Tumor Site: Upper outer quadrant   Tumor Bed Identified: Yes  Size/Extent of Tumor Bed: 3.6 x 3.4 x 2.0 cm  Size/Extent of Residual Invasive Tumor: 0.2cm in aggregate dimension  Average Residual Viable Cancer Cellularity of the Tumor Bed: 2%   Histologic Type: Invasive ductal carcinoma  Histologic Grade (Nottingham Histologic Score): moderate differentiated,  II/III  Tubule Formation: 3  Nuclear Grade: 2  Mitotic Count (40x objective): 1  Total Nottingham Score: 6/9  Surgical Margins: 1.0cm from closest margin (deep margin)   Ductal Carcinoma In-situ (DCIS): Absent  Lobular Carcinoma In-situ (LCIS): Absent  Lymph-Vascular Invasion: Identified   Perineural Invasion: Not identified  Tumor Necrosis: Not identified  Nipple Involvement: Not identified  Skin Involvement: Not identified  Lymph Node Sampling: sentinel lymph node with axillary dissection  Total number of involved nodes/total nodes found: 6/15   Size of largest metastasis: 0.1cm  Extranodal extension: Not identified  Non-neoplastic Breast Tissue: Fibrosis  Overall Response to Neoadjuvant Therapy:  In the Breast: Partial  In the Lymph  Nodes: Partial  Prognostic markers: See Chartmaxx for addendum Image Analysis report  Time between tumor removal and placement into formalin < 1 hour: Yes  Fixation Time between 6-48 hours: Yes  Pathologic Staging:   ypT1a, N62mi      CBC w diff    Lab Results   Component Value Date/Time    WBC 7.6 12/26/2019 11:01 AM    RBC 4.54 12/26/2019 11:01 AM    HGB 14.0 12/26/2019 11:01 AM    HCT 41.6 12/26/2019 11:01 AM    MCV 91.6 12/26/2019 11:01 AM    MCH 30.8 12/26/2019 11:01 AM    MCHC 33.7 12/26/2019 11:01 AM    RDW 13.9 12/26/2019 11:01 AM    PLTCT 277 12/26/2019 11:01 AM    MPV 7.7 12/26/2019 11:01 AM    Lab Results   Component Value Date/Time    NEUT 75 12/26/2019 11:01 AM    ANC 5.70 12/26/2019 11:01 AM    LYMA 14 (L) 12/26/2019 11:01 AM    ALC 1.10 12/26/2019 11:01 AM    MONA 9 12/26/2019 11:01 AM    AMC 0.70 12/26/2019 11:01 AM    EOSA 1 12/26/2019 11:01 AM    AEC 0.10 12/26/2019 11:01 AM    BASA 1 12/26/2019 11:01 AM    ABC 0.10 12/26/2019 11:01 AM        Comprehensive Metabolic Profile    Lab Results   Component Value Date/Time    NA 141 12/26/2019 11:01 AM    K 3.3 (L) 12/26/2019 11:01 AM    CL 102 12/26/2019 11:01 AM    CO2 32 (H) 12/26/2019 11:01 AM    GAP 7 12/26/2019 11:01 AM    BUN 13 12/26/2019 11:01 AM    CR 0.79 12/26/2019 11:01 AM    GLU 84 12/26/2019 11:01 AM    Lab Results   Component Value Date/Time    CA 9.9 12/26/2019 11:01 AM    ALBUMIN 4.1 12/26/2019 11:01 AM    TOTPROT 7.0 12/26/2019 11:01 AM    ALKPHOS 72 12/26/2019 11:01 AM    AST 18 12/26/2019 11:01 AM    ALT 19 12/26/2019 11:01 AM    TOTBILI 0.5 12/26/2019 11:01 AM    GFR >60 12/26/2019 11:01 AM    GFRAA >60 12/26/2019 11:01 AM        SCANS:   CT CHEST/ABDOMEN 12/02/2009:   CHEST:   1. STATUS POST RIGHT MASTECTOMY AND AXILLARY LYMPH NODE DISSECTION WITHOUT EVIDENCE OF PULMONARY METASTATIC DISEASE OR THORACIC LYMPHADENOPATHY.  ABDOMEN AND PELVIS:  1. NO EVIDENCE OF HEPATIC METASTATIC DISEASE OR ABDOMINOPELVIC LYMPHADENOPATHY.   BONE SCAN 12/02/2009:  NO SCINTIGRAPHIC EVIDENCE OF OSSEOUS METASTATIC DISEASE   BONE SCAN 12/01/2010:  NO SCINTIGRAPHIC EVIDENCE OF OSSEOUS METASTATIC DISEASE.   12/14/2011 BONE SCAN:   DEGENERATIVE CHANGES OF THE AXIAL AND APPENDICULAR SKELETON AS DESCRIBED WITHOUT EVIDENCE OF OSSEOUS METASTASIS.   12/01/2010 CHEST X-RAY:  STATUS POST RIGHT MASTECTOMY AND AXILLARY LYMPH NODE DISSECTION WITHOUT   EVIDENCE OF ACUTE DISEASE IN THE CHEST.   12/14/2011 CHEST X-RAY:   NO ACUTE DISEASE IN THE CHEST.   ?  BREAST IMAGING:   12/09/2013 LEFT MAMMOGRAM: ASSESSMENT: BIRAD 1-NEGATIVE  12/22/2014 LEFT MAMMOGRAM:  ASSESSMENT: BIRAD 1 - NEGATIVE  12/07/2015 LEFT MAMMOGRAM: ACR BI-RADS? Assessments: BIRAD 1-Negative  12/08/2016 LEFT MAMMOGRAM:  ACR BI-RADS? Assessments: BIRAD 1-Negative  12/14/2017 LEFT MAMMOGRAM: ACR BI-RADS? Assessments: BIRAD 1-Negative  12/20/2018 LEFT MAMMOGRAM:  ASSESSMENT: BIRAD 1-Negative   12/26/2019 LEFT MAMMOGRAM:  ASSESSMENT: BIRAD 1-Negative     BONE HEALTH:   06/01/2015 BMD:  Improvement since 2016 evaluation; mild osteopenia.   12/14/2017 BMD: Stable bone mineral density with persistent low bone mass (osteopenia). 12/26/2019 BMD:  Persistent low bone mass (osteopenia) without significant overall change since 2019.     ASSESSMENT AND PLAN:   1. Right breast CA, 10/2008. Clinical stage T2N1M0; IIB (ER/PR positive, HER-2 negative Grade 3, low PR probable luminal B). Completed 4 cycles of EC followed by 4 cycles of taxotere eoadjuvantly and underwent modified radical mastectomy 04/14/2009 with pathologic staging ypT1aN1(mic)M0.She has completed right chest wall radiation therapy under Dr. Darcus Austin on 07/16/2009. Now on Femara since 07/17/2009. Patient is tolerating endocrine therapy well. Completed AI therapy (letrozole) in 07/2019.   2. History of right upper extremetry DVT:  She was initially on warfarin and managed by PCP in [place], [place]. She has completed anti-coagulation therapy. No further problems.   3. Patient completed 2 years of  Zometa 08/2011. BMD 05/2013; stable mild osteopenia. Repeat 06/23/2014 showed moderate osteopenia. Repeat 05/2015; improvement to mild osteopenia. Repeat 12/14/2017; stable osteopenia. Repeat BMD 12/26/2019; stable osteopenia.   4. Reviewed signs to watch for that could indicate a local or distant recurrence. Patient will call our office with any new signs.   Local recurrence  Signs and symptoms of local recurrence following mastectomy/reconstruction may include:   ? One or more painless nodules on or under the skin of your chest wall   ? A new area of thickening along or near the mastectomy scar  Regional recurrence   A regional breast cancer recurrence means the cancer has come back in the lymph nodes in your armpit or collarbone area. Signs and symptoms of regional recurrence may include:   ? A lump or swelling in the lymph nodes under your arm or in the groove above your collarbone   ? Swelling of your arm (this could be related to lymphedema or even a blood clot)  ? Persistent pain in your arm and shoulder   ? Increasing loss of sensation in your arm and hand  Distant (metastatic) recurrence   A distant, or metastatic, recurrence means the cancer has traveled to distant parts of the body, most commonly the bones, liver and lungs. The signs and symptoms may include:   ? Pain, such as chest or bone pain   ? Persistent, dry cough   ? Difficulty breathing   ? Loss of appetite   ? Persistent nausea, vomiting or weight loss   ? Swelling in the abdomen  ? Severe headaches  When to call our office   You know your body best ? what feels normal and what doesn't. It's important to be aware of the signs and symptoms of recurrent breast cancer, such as:   ? New and persistent pain   ? Changes or new lumps in your breast or surgical scar or chest wall   ? Weight loss   ? Shortness of breath  If you experience any signs and symptoms that might suggest a recurrence, call our office. Patient verbalized understanding and phone numbers provided.    5. History of glaucoma. Dr. Mignon Pine [place], ).  6. Genetic testing: [employer] testing: Common Hereditary Cancers Panel 05/10/2017: VUS RAD50 c.1336A>G (p.Lys446Glu)  7. RTC 12 months with lab and left screening mammogram.      I have spent 5 minutes preparing for this visit. I have spent 25 minutes face to face in evaluation, management, counseling and  coordination of care functions.   Total time: 30 minutes.     Collaborating physician: Joni Reining, MD  NPI # [id number]      Sharia Reeve, APRN-NP

## 2019-12-26 ENCOUNTER — Encounter: Admit: 2019-12-26 | Discharge: 2019-12-26 | Payer: MEDICARE

## 2019-12-26 DIAGNOSIS — H409 Unspecified glaucoma: Secondary | ICD-10-CM

## 2019-12-26 DIAGNOSIS — F419 Anxiety disorder, unspecified: Secondary | ICD-10-CM

## 2019-12-26 DIAGNOSIS — Z853 Personal history of malignant neoplasm of breast: Secondary | ICD-10-CM

## 2019-12-26 DIAGNOSIS — R5383 Other fatigue: Secondary | ICD-10-CM

## 2019-12-26 DIAGNOSIS — E785 Hyperlipidemia, unspecified: Secondary | ICD-10-CM

## 2019-12-26 DIAGNOSIS — Z780 Asymptomatic menopausal state: Secondary | ICD-10-CM

## 2019-12-26 DIAGNOSIS — Z1231 Encounter for screening mammogram for malignant neoplasm of breast: Secondary | ICD-10-CM

## 2019-12-26 DIAGNOSIS — Z9221 Personal history of antineoplastic chemotherapy: Secondary | ICD-10-CM

## 2019-12-26 DIAGNOSIS — R7611 Nonspecific reaction to tuberculin skin test without active tuberculosis: Secondary | ICD-10-CM

## 2019-12-26 DIAGNOSIS — M8580 Other specified disorders of bone density and structure, unspecified site: Secondary | ICD-10-CM

## 2019-12-26 DIAGNOSIS — E559 Vitamin D deficiency, unspecified: Secondary | ICD-10-CM

## 2019-12-26 DIAGNOSIS — I10 Essential (primary) hypertension: Secondary | ICD-10-CM

## 2019-12-26 DIAGNOSIS — Z1379 Encounter for other screening for genetic and chromosomal anomalies: Secondary | ICD-10-CM

## 2019-12-26 DIAGNOSIS — Z9189 Other specified personal risk factors, not elsewhere classified: Secondary | ICD-10-CM

## 2019-12-26 DIAGNOSIS — Z13820 Encounter for screening for osteoporosis: Secondary | ICD-10-CM

## 2019-12-26 DIAGNOSIS — M1990 Unspecified osteoarthritis, unspecified site: Secondary | ICD-10-CM

## 2019-12-26 DIAGNOSIS — D126 Benign neoplasm of colon, unspecified: Secondary | ICD-10-CM

## 2019-12-26 DIAGNOSIS — Z79811 Long term (current) use of aromatase inhibitors: Secondary | ICD-10-CM

## 2019-12-26 LAB — COMPREHENSIVE METABOLIC PANEL
Lab: 102 MMOL/L (ref 98–110)
Lab: 13 mg/dL (ref 7–25)
Lab: 141 MMOL/L (ref 137–147)
Lab: 18 U/L (ref 7–40)
Lab: 19 U/L (ref 7–56)
Lab: 3.3 MMOL/L — ABNORMAL LOW (ref 3.5–5.1)
Lab: 32 MMOL/L — ABNORMAL HIGH (ref 21–30)
Lab: 4.1 g/dL — ABNORMAL LOW (ref 3.5–5.0)
Lab: 7 K/UL (ref 3–12)
Lab: 72 U/L (ref 25–110)
Lab: 84 mg/dL (ref 70–100)
Lab: >60 mL/min (ref >60)
Lab: >60 mL/min (ref >60)

## 2019-12-26 LAB — CBC AND DIFF
Lab: 0.1 10*3/uL (ref 0–0.20)
Lab: 4.5 M/UL (ref 4.0–5.0)
Lab: 7.6 10*3/uL (ref 4.5–11.0)

## 2019-12-26 NOTE — Patient Instructions
Thank you for coming to see us today.   Please call our office or send a message through MyChart if you have any questions or concerns.                                                                                                                                                           Mary Williams RN, BSN, OCN, CBCN, CN-BN  Clinical Nurse Coordinator for Lori Ranallo, APRN-BC        Breast Cancer Survivorship/High Risk Breast Clinic  Lymphedema Screening & Prevention Program   [phone] (phone)  [phone] (fax)      [facility]   Clinic: Monday, Thursday, Friday   Admin: Tuesday  [address]  [place], [place] [address]  Scheduling: [phone]  Fax: [phone]   [facility]   Clinic: Wednesday  [address]  [place], [place] [address]  Scheduling: [phone] (Tara) or [phone] (Nicki)  Fax: [phone] or [phone]

## 2020-03-13 ENCOUNTER — Encounter: Admit: 2020-03-13 | Discharge: 2020-03-13 | Payer: MEDICARE

## 2020-03-13 DIAGNOSIS — E559 Vitamin D deficiency, unspecified: Secondary | ICD-10-CM

## 2020-12-09 NOTE — Patient Instructions
Claressa,     Thank you for coming to see [place] today.   Please call our office or send a message through [place] if you have any questions or concerns.                                                                                                                                                           Sheppard Coil RN, BSN, OCN, CBCN, CN-BN  Clinical Nurse Coordinator for Tona Sensing, APRN-BC        Breast Cancer Prevention and Survivorship Clinic   [phone] (nurse phone)  [phone] ([place] or [place] 24/7)  [phone] (fax)      [facility]: Monday, Thursday, Friday   Admin: Tuesday  [address] [place]  [place], [place] [address]  Scheduling: [phone]  Fax: [phone]   [facility] [place] [facility]: Wednesday  [address]  [place], [place] [address]  Scheduling: [phone] Sherron Flemings Raeanne Barry)                        [phone] [facility])                        [phone] Boris Lown)  Fax: [phone] or [phone]

## 2020-12-09 NOTE — Progress Notes
Name: Tyechia Delcastillo          MRN: [id number]      DOB: 04/12/1950      AGE: 70 y.o.   DATE OF SERVICE: 12/10/2020    Subjective:             Reason for Visit: Routine surveillance.     Breast Cancer ([facility])    Daven Hochman is a 70 y.o. female     DIAGNOSIS: Right breast CA, 10/30/2008 (age 28)    STAGE: cT2N1M0, Stage IIB; ER+ PR+ HER-2/neu negative; Grade 3. Post neoadjuvant pathologic staging ypT1aN1(mic)M0    TREATMENT SUMMARY:  Ms. Iler is a 70 year old female who underwent rountine screening mammogram on 10/21/2008 at [facility] which demonstrated a spiculated density in the UOQ of the right breast (BIRADS Category 0). Additional imaging was recommended and the patient underwent a diagnostic mammogram of the right breast with ultrasound which were read as BIRADS Category 4. These imaging studies revealed a 10 o'clock lesion in the right breast measuring 1.6 x 1.3 x 1.3cm. CNBX was performed on 10/27/2008 and demonstrated invasive ductal carcinoma, Grade 3, ER 100%, PR 22%, Ki-67 3%, HER-2/neu 0+ by IHC, p53 0%, EGFR 0%. She then sought an opinion with Dr. Aram Beecham at [facility] and repeat breast imaging was performed. Ultrasound on 11/10/2008 revealed an increase in the right breast mass 10 o'clock position 7cm FTN measuring 2.5 x 2.7 x 1.8cm. In addition there was a suspicious appearing right axillary lymph node identified measuring 0.9 x 1.8 x 0.7cm. Biopsy of the lymph node 11/10/2008 demonstrated metastatic carcinoma, consistent with ductal carcinoma of the breast with predictive markers of ER 99%, PR 15%, Ki-67 7%, HER-2/neu 0+ by IHC and FISH 1.0. MRI was also performed on 11/10/2008 and the right breast carcinoma measured 2.5 x 3.5 x 3.9 cm. The patient had a portacath placed by Dr. Fredricka Bonine 11/20/2008.   ?  Neoadjuvant chemotherapy was recommended; the patient completed Epirubicin/Cyclophosphamide x 4 cycles (11/20/2008 - 12/31/2008) and Taxotere 75mg /m2 every 3 weeks (01/14/2009- 03/18/2009). She underwent right modified radical mastectomy 04/14/2009 Fredricka Bonine). Pathology showed residual  0.2cm, grade 2, IDC with ALND (6/15) positive lymph nodes.  Prognostic markers on mastectomy: ER 100%, PR 0%, HER-2 0+, Ki-67 35%. Prognostic markers on LN: ER 100%, PR 0%, HER-2 0+, Ki-67 1%. Right chest wall radiation (Dr. Joycelyn Man) completed 07/16/2009. She then started antihormone therapy with femara (06/2009) and received bisphosphonate therapy with Zometa 07/17/2009 and completed 2 years of therapy (09/07/2011).  Port removed on 06/15/10.  ?  PRESENT THERAPY:  Currently on letrozole (06/2009); will completed 07/2019.   ?  Medical Team:   Surgeon: Aram Beecham, MD  [facility]  [address] [place], [place] [address]  [phone]  ?  Medical oncologist: Lowella Petties, MD  [facility]  [address] [place], [place] [address]  [phone]  ?  Radiation oncologist: Joycelyn Man, MD  [facility]  [address]  [place], [place] [address]  [phone]    Family History   Problem Relation Age of Onset   ? Dementia Father    ? COPD Father    ? Coronary Artery Disease Mother    ? Glaucoma Mother    ? Cancer-Pancreas Paternal Uncle 41   ? Cancer Paternal Grandmother         unknown primary-tumor between spine &  esophagus   ? Cancer-Hematologic Paternal Grandmother         lymphoma   ? Cancer-Ovarian Paternal Aunt 1   ? Cancer-Lung Paternal Uncle    ? Cancer-Prostate Brother 58   ? None Reported Son    ? None Reported Daughter    ? None Reported Sister         Lives in [place]       GENETIC TESTING:   [employer] testing: Common Hereditary Cancers Panel 05/10/2017: VUS RAD50 c.1336A>G (p.Lys446Glu)    HISTORY OF PRESENT ILLNESS:   The patient returns to the clinic today for continued follow-up in the Breast Cancer Survivorship Clinic. She has a history of right breast CA, 10/30/2008 (57). Stage IIB; ER+ PR+ HER-2/neu negative; Grade 3. Post neoadjuvant pathologic staging ypT1aN1(mic)M0. Post mastectomy radiation therapy (Dr. Joycelyn Man) completed 07/16/2009. She then started antihormone therapy with femara (06/2009) and received bisphosphonate therapy with Zometa 07/17/2009 and completed 2 years of therapy (09/07/2011).  Port removed on 06/15/10. Continues on letrozole (06/2009); will complete in 10 years (06/2019). She is feeling well, denies changes on the right chest wall, denies left breast changes.       Review of Systems   Significant fatigue is a chronic problem.   Now established with Fredia Sorrow, MD (PCP); [place], [place].   Her husband Ree Kida) was diagnosed with A-FIB and had (cardiac ablation in 06/2015; Dr. Wallene Huh, cardiology [facility])  Cardioversion in 07/2015. Nickolas Madrid, MD @ [facility]). 07/2016; cardiac ablation x 2. Holter monitor x one week. 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   Femara completed 07/2019; feels more energetic.   They are traveling again; being careful.   COVID vaccination completed (Moderna; 05/09/2019 and 06/06/2019).   COVID booster 03/05/2020; Moderna.   [employer] testing: Common Hereditary Cancers Panel 05/10/2017: VUS RAD50 c.1336A>G (p.Lys446Glu)  Significant right hip pain aching, burning pain down the right leg and numbness resolved with steroid injection (08/2014).   Weight (BMI 36.58; # 196 <- 35.31; # 189 <- 36.47; # 190 <- 36.31; # 194 <- 31.55 <- 33.52 <- 34.0 <- 35.3 <- 34.4 <- 34.12 <- 34.29 <- 34.30).   No SOB, cough or swelling of the extremities.   No chest pain or palpitations.  No changes in the right chest wall. Denies left breast changes.   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   No weakness. No skin rash.   No nausea, vomiting, diarrhea, constipation or abdominal pain.  Colonoscopy 2014 (tubular adenoma). Repeat 03/2018; polyp high grade dysplasia. Repeat recommended 03/2019. She had a repeat colonoscopy 04/2019; small polyp. Repeat 3 years Marge Duncans).  Due 2024  No FH colon CA (father did have polyps).    No dysuria or hematuria. Occasional hematochezia (hemmorhoid).   Frequent urination.  Post menopausal; uterus and ovaries intact. No bleeding.   Occasional hot flashes; tolerable. No vaginal dryness.   Has not restarted YOGA, enjoys reading so she does not exercise much.    No depression or anxiety.       Objective:         ? ACETAMINOPHEN (TYLENOL PO) Take  by mouth as Needed.   ? amLODIPine (NORVASC) 10 mg tablet Take 10 mg by mouth every morning.   ? brimonidine(+) (ALPHAGAN) 0.2 % OP ophthalmic solution Apply 1 Drop to both eyes Twice Daily.   ? CALCIUM CITRATE/VITAMIN D3 (CALCIUM CITRATE + D PO) Take 630 mg by mouth twice  daily.   ? cholecalciferol (vitamin D3) 100 mcg (4,000 unit) cap Take 1,000 Units by mouth daily.   ? IBUPROFEN PO Take  by mouth as Needed.   ? latanoprost (XALATAN) 0.005 % ophthalmic solution Place 1 Drop into or around eye(s) At Bedtime Daily.   ? loratadine (CLARITIN) 10 mg PO tablet Take 10 mg by mouth daily.   ? MULTIVITAMIN PO Take  by mouth.   ? PARoxetine HCL (PAXIL) 20 mg tablet Take 20 mg by mouth at bedtime daily.   ? simvastatin (ZOCOR) 40 mg tablet Take 40 mg by mouth at bedtime daily.   ? timolol (TIMOPTIC) 0.5 % OP ophthalmic solution Apply 1 Drop to both eyes Twice Daily.   ? walker medical supply .MEDSUPPLY     Vitals:    12/10/20 1346   BP: 118/71   BP Source: Arm, Left Upper   Pulse: 72   Temp: 36.7 ?C (98 ?F)   Resp: 16   SpO2: 99%   TempSrc: Oral   PainSc: Zero   Weight: 88.9 kg (196 lb)  Comment: With shoes on   Height: 155.9 cm (5' 1.38)     Body mass index is 36.58 kg/m?Marland Kitchen        Pain Addressed:  N/A     Fatigue Scale:0    Patient Evaluated for a Clinical Trial: No treatment clinical trial available for this patient.     [place] Cooperative Oncology Group performance status is 0, Fully active, able to carry on all pre-disease performance without restriction.Marland Kitchen     Physical Exam  Vitals reviewed. Chest:       Lymphadenopathy:      Cervical: No cervical adenopathy.      Upper Body:      Right upper body: No supraclavicular adenopathy.      Left upper body: No supraclavicular adenopathy.     This is a 70 year old female in no acute distress; well-developed, well-nourished.   [employer] testing: Common Hereditary Cancers Panel 05/10/2017: VUS RAD50 c.1336A>G (p.Lys446Glu)  HEENT: No icterus  Neck: No JVD, supple.   Chest: CTA bilaterally.   CV: RRR without murmur.   Abdomen: Soft, non-distended, non-tender, positive bowel sounds, no organomegaly.    Skin: No rash.   Back: No tenderness with palpation or percussion over the spine.   Extremitites: No clubbing, cyanosis or edema.   CN: II-XII grossly intact; no sensory or motor abnormalities.        Lab Results   Component Value Date/Time    CA2729 26.1 06/01/2015 01:05 PM     Lab [facility] 12/02/2015:   CBC/chemistry: Glucose 121  Otherwise within normal limits    Received lab 03/13/2020:   Vitamin D drawn 02/27/2020: (66) from [facility] in [place], [place]. Continue taking Vitamin D3 (4,000IU) daily.        SURGICAL PATHOLOGY 04/14/2009:   Final Diagnosis:  A. 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    B. Lymph node (1), sentinel lymph node, biopsy:  Metastatic carcinoma, consistent with metastatic ductal carcinoma (1/1) evident on pan-cytokeratin staining.    C. Lymph node (1), sentinel lymph node, biopsy:   Metastatic carcinoma, consistent with metastatic ductal carcinoma (1/1).    D. Lymph nodes (3), sentinel lymph node, biopsy:   There is no evidence of malignancy (0/3).  The diagnosis is supported by negative immunohistochemical staining for pan-cytokeratin.    E. Fibroadipose tissue,  additional level 1 right axillary tissue, biopsy:  Benign soft tissue.  There is no evidence of malignancy.     F. Fibroadipose tissue, additional level 2 right axillary, biopsy:  Benign soft tissue.  There is no evidence of malignancy.     G. Breast tissue and lymph nodes (7), right breast with level 1 and 2 axillary dissection, mastectomy:   Residual invasive ductal carcinoma, nuclear grade 2. See comment.  Metastatic carcinoma involving two lymph nodes (2/7).     Comment:  INVASIVE CARCINOMA OF THE BREAST STATUS POST NEOADJUVANT THERAPY  Specimen Type: Mastectomy   Laterality: Right  Tumor Site: Upper outer quadrant   Tumor Bed Identified: Yes  Size/Extent of Tumor Bed: 3.6 x 3.4 x 2.0 cm  Size/Extent of Residual Invasive Tumor: 0.2cm in aggregate dimension  Average Residual Viable Cancer Cellularity of the Tumor Bed: 2%   Histologic Type: Invasive ductal carcinoma  Histologic Grade (Nottingham Histologic Score): moderate differentiated,  II/III  Tubule Formation: 3  Nuclear Grade: 2  Mitotic Count (40x objective): 1  Total Nottingham Score: 6/9  Surgical Margins: 1.0cm from closest margin (deep margin)   Ductal Carcinoma In-situ (DCIS): Absent  Lobular Carcinoma In-situ (LCIS): Absent  Lymph-Vascular Invasion: Identified   Perineural Invasion: Not identified  Tumor Necrosis: Not identified  Nipple Involvement: Not identified  Skin Involvement: Not identified  Lymph Node Sampling: sentinel lymph node with axillary dissection  Total number of involved nodes/total nodes found: 6/15   Size of largest metastasis: 0.1cm  Extranodal extension: Not identified  Non-neoplastic Breast Tissue: Fibrosis  Overall Response to Neoadjuvant Therapy:  In the Breast: Partial  In the Lymph Nodes: Partial  Prognostic markers: See Chartmaxx for addendum Image Analysis report  Time between tumor removal and placement into formalin < 1 hour: Yes  Fixation Time between 6-48 hours: Yes  Pathologic Staging:   ypT1a, N19mi    CBC w diff    Lab Results   Component Value Date/Time    WBC 7.6 12/10/2020 12:30 PM    RBC 4.13 12/10/2020 12:30 PM    HGB 12.7 12/10/2020 12:30 PM    HCT 37.3 12/10/2020 12:30 PM    MCV 90.3 12/10/2020 12:30 PM    MCH 30.7 12/10/2020 12:30 PM    MCHC 34.0 12/10/2020 12:30 PM    RDW 14.6 12/10/2020 12:30 PM    PLTCT 257 12/10/2020 12:30 PM    MPV 7.4 12/10/2020 12:30 PM    Lab Results   Component Value Date/Time    NEUT 68 12/10/2020 12:30 PM    ANC 5.20 12/10/2020 12:30 PM    LYMA 19 (L) 12/10/2020 12:30 PM    ALC 1.50 12/10/2020 12:30 PM    MONA 10 12/10/2020 12:30 PM    AMC 0.80 12/10/2020 12:30 PM    EOSA 2 12/10/2020 12:30 PM    AEC 0.20 12/10/2020 12:30 PM    BASA 1 12/10/2020 12:30 PM    ABC 0.10 12/10/2020 12:30 PM        Comprehensive Metabolic Profile    Lab Results   Component Value Date/Time    NA 139 12/10/2020 12:30 PM    K 3.7 12/10/2020 12:30 PM    CL 104 12/10/2020 12:30 PM    CO2 30 12/10/2020 12:30 PM    GAP 5 12/10/2020 12:30 PM    BUN 18 12/10/2020 12:30 PM    CR 0.76 12/10/2020 12:30 PM    GLU 81 12/10/2020 12:30 PM    Lab Results  Component Value Date/Time    CA 9.5 12/10/2020 12:30 PM    ALBUMIN 3.9 12/10/2020 12:30 PM    TOTPROT 6.6 12/10/2020 12:30 PM    ALKPHOS 58 12/10/2020 12:30 PM    AST 13 12/10/2020 12:30 PM    ALT 15 12/10/2020 12:30 PM    TOTBILI 0.4 12/10/2020 12:30 PM    GFR >60 12/26/2019 11:01 AM    GFRAA >60 12/26/2019 11:01 AM          SCANS:   CT CHEST/ABDOMEN 12/02/2009:   CHEST:   1. STATUS POST RIGHT MASTECTOMY AND AXILLARY LYMPH NODE DISSECTION WITHOUT EVIDENCE OF PULMONARY METASTATIC DISEASE OR THORACIC LYMPHADENOPATHY.  ABDOMEN AND PELVIS:  1. NO EVIDENCE OF HEPATIC METASTATIC DISEASE OR ABDOMINOPELVIC LYMPHADENOPATHY.   BONE SCAN 12/02/2009:  NO SCINTIGRAPHIC EVIDENCE OF OSSEOUS METASTATIC DISEASE   BONE SCAN 12/01/2010:  NO SCINTIGRAPHIC EVIDENCE OF OSSEOUS METASTATIC DISEASE.   12/14/2011 BONE SCAN:   DEGENERATIVE CHANGES OF THE AXIAL AND APPENDICULAR SKELETON AS DESCRIBED WITHOUT EVIDENCE OF OSSEOUS METASTASIS.   12/01/2010 CHEST X-RAY:  STATUS POST RIGHT MASTECTOMY AND AXILLARY LYMPH NODE DISSECTION WITHOUT EVIDENCE OF ACUTE DISEASE IN THE CHEST.   12/14/2011 CHEST X-RAY:   NO ACUTE DISEASE IN THE CHEST.   ?  BREAST IMAGING:   12/09/2013 LEFT MAMMOGRAM: ASSESSMENT: BIRAD 1-NEGATIVE  12/22/2014 LEFT MAMMOGRAM:  ASSESSMENT: BIRAD 1 - NEGATIVE  12/07/2015 LEFT MAMMOGRAM: ACR BI-RADS? Assessments: BIRAD 1-Negative  12/08/2016 LEFT MAMMOGRAM:  ACR BI-RADS? Assessments: BIRAD 1-Negative  12/14/2017 LEFT MAMMOGRAM: ACR BI-RADS? Assessments: BIRAD 1-Negative  12/20/2018 LEFT MAMMOGRAM:  ASSESSMENT: BIRAD 1-Negative   12/26/2019 LEFT MAMMOGRAM:  ASSESSMENT: BIRAD 1-Negative   12/10/2020 LEFT MAMMOGRAM:  ASSESSMENT: BIRAD 1-Negative     BONE HEALTH:   06/01/2015 BMD:  Improvement since 2016 evaluation; mild osteopenia.   12/14/2017 BMD: Stable bone mineral density with persistent low bone mass (osteopenia).   12/26/2019 BMD:  Persistent low bone mass (osteopenia) without significant overall change since 2019.     Vitamin D (66) from [facility] in [place], [place]. Continue taking Vitamin D3 (4,000IU) daily.     ASSESSMENT AND PLAN:   1. Right breast CA, 10/2008. Clinical stage T2N1M0; IIB (ER/PR positive, HER-2 negative Grade 3, low PR probable luminal B). Completed 4 cycles of EC followed by 4 cycles of taxotere eoadjuvantly and underwent modified radical mastectomy 04/14/2009 with pathologic staging ypT1aN1(mic)M0.She has completed right chest wall radiation therapy under Dr. Darcus Austin on 07/16/2009. Now on Femara since 07/17/2009. Patient is tolerating endocrine therapy well. Completed AI therapy (letrozole) in 07/2019.   2. History of right upper extremetry DVT:  She was initially on warfarin and managed by PCP in [place], [place]. She has completed anti-coagulation therapy. No further problems.   3. Genetic testing: [employer] testing: Common Hereditary Cancers Panel 05/10/2017: VUS RAD50 c.1336A>G (p.Lys446Glu)  4. Patient completed 2 years of  Zometa 08/2011. BMD 05/2013; stable mild osteopenia. Repeat 06/23/2014 showed moderate osteopenia. Repeat 05/2015; improvement to mild osteopenia. Repeat 12/14/2017; stable osteopenia. Repeat BMD 12/26/2019; stable osteopenia. Repeat in 11/2021.   5. Reviewed signs to watch for that could indicate a local or distant recurrence. Patient will call our office with any new signs.   Local recurrence  Signs and symptoms of local recurrence following mastectomy/reconstruction may include:   ? One or more painless nodules on or under the skin of your chest wall   ? A new area of thickening along or near the mastectomy scar  Regional recurrence   A regional breast  cancer recurrence means the cancer has come back in the lymph nodes in your armpit or collarbone area. Signs and symptoms of regional recurrence may include:   ? A lump or swelling in the lymph nodes under your arm or in the groove above your collarbone   ? Swelling of your arm (this could be related to lymphedema or even a blood clot)  ? Persistent pain in your arm and shoulder   ? Increasing loss of sensation in your arm and hand  Distant (metastatic) recurrence   A distant, or metastatic, recurrence means the cancer has traveled to distant parts of the body, most commonly the bones, liver and lungs. The signs and symptoms may include:   ? Pain, such as chest or bone pain   ? Persistent, dry cough   ? Difficulty breathing   ? Loss of appetite   ? Persistent nausea, vomiting or weight loss   ? Swelling in the abdomen  ? Severe headaches  When to call our office   You know your body best -- what feels normal and what doesn't. It's important to be aware of the signs and symptoms of recurrent breast cancer, such as:   ? New and persistent pain   ? Changes or new lumps in your breast or surgical scar or chest wall   ? Weight loss   ? Shortness of breath  If you experience any signs and symptoms that might suggest a recurrence, call our office. Patient verbalized understanding and phone numbers provided.    6. History of glaucoma. Dr. Mignon Pine Leslie, [place]).  7. RTC 12 months with lab, BMD and left screening mammogram.      Total Time Today was 30 minutes in the following activities: Preparing to see the patient, Obtaining and/or reviewing separately obtained history, Performing a medically appropriate examination and/or evaluation, Counseling and educating the patient/family/caregiver, Ordering medications, tests, or procedures, Referring and communication with other health care professionals (when not separately reported), Documenting clinical information in the electronic or other health record, Independently interpreting results (not separately reported) and communicating results to the patient/family/caregiver and Care coordination (not separately reported)    Ernie Hew, APRN-BC, CBCN  Nurse Practitioner  Collaborating physician: Dellia Cloud, MD  NPI # [id number]    Sharia Reeve, APRN-NP

## 2020-12-10 ENCOUNTER — Encounter: Admit: 2020-12-10 | Discharge: 2020-12-10 | Payer: MEDICARE

## 2020-12-10 DIAGNOSIS — Z9221 Personal history of antineoplastic chemotherapy: Secondary | ICD-10-CM

## 2020-12-10 DIAGNOSIS — Z1231 Encounter for screening mammogram for malignant neoplasm of breast: Secondary | ICD-10-CM

## 2020-12-10 DIAGNOSIS — Z79899 Other long term (current) drug therapy: Secondary | ICD-10-CM

## 2020-12-10 DIAGNOSIS — Z1379 Encounter for other screening for genetic and chromosomal anomalies: Secondary | ICD-10-CM

## 2020-12-10 DIAGNOSIS — R5383 Other fatigue: Secondary | ICD-10-CM

## 2020-12-10 DIAGNOSIS — Z9189 Other specified personal risk factors, not elsewhere classified: Secondary | ICD-10-CM

## 2020-12-10 DIAGNOSIS — Z780 Asymptomatic menopausal state: Secondary | ICD-10-CM

## 2020-12-10 DIAGNOSIS — M1990 Unspecified osteoarthritis, unspecified site: Secondary | ICD-10-CM

## 2020-12-10 DIAGNOSIS — E785 Hyperlipidemia, unspecified: Secondary | ICD-10-CM

## 2020-12-10 DIAGNOSIS — I10 Essential (primary) hypertension: Secondary | ICD-10-CM

## 2020-12-10 DIAGNOSIS — Z13820 Encounter for screening for osteoporosis: Secondary | ICD-10-CM

## 2020-12-10 DIAGNOSIS — H409 Unspecified glaucoma: Secondary | ICD-10-CM

## 2020-12-10 DIAGNOSIS — M85851 Other specified disorders of bone density and structure, right thigh: Secondary | ICD-10-CM

## 2020-12-10 DIAGNOSIS — F419 Anxiety disorder, unspecified: Secondary | ICD-10-CM

## 2020-12-10 DIAGNOSIS — Z853 Personal history of malignant neoplasm of breast: Secondary | ICD-10-CM

## 2020-12-10 DIAGNOSIS — D126 Benign neoplasm of colon, unspecified: Secondary | ICD-10-CM

## 2020-12-10 DIAGNOSIS — R7611 Nonspecific reaction to tuberculin skin test without active tuberculosis: Secondary | ICD-10-CM

## 2020-12-10 LAB — COMPREHENSIVE METABOLIC PANEL
ALBUMIN: 3.9 g/dL (ref 3.5–5.0)
ALK PHOSPHATASE: 58 U/L (ref 25–110)
EGFR: >60 mL/min (ref >60)
SODIUM: 139 MMOL/L (ref 137–147)
TOTAL PROTEIN: 6.6 g/dL (ref 6.0–8.0)

## 2020-12-10 LAB — CBC AND DIFF
ABSOLUTE BASO COUNT: 0.1 K/UL (ref 0–0.20)
ABSOLUTE EOS COUNT: 0.2 K/UL (ref 0–0.45)
ABSOLUTE LYMPH COUNT: 1.5 K/UL (ref 1.0–4.8)
ABSOLUTE MONO COUNT: 0.8 K/UL (ref 0–0.80)
ABSOLUTE NEUTROPHIL: 5.2 K/UL (ref 1.8–7.0)
EOSINOPHILS %: 2 % (ref 0–5)
HEMATOCRIT: 37 % (ref 36–45)
HEMOGLOBIN: 12 g/dL (ref 12.0–15.0)
LYMPHOCYTES %: 19 % — ABNORMAL LOW (ref 24–44)
MCH: 30 pg (ref 26–34)
MCV: 90 FL (ref 80–100)
MONOCYTES %: 10 % (ref 4–12)
NEUTROPHILS %: 68 % (ref 41–77)
RBC COUNT: 4.1 M/UL (ref 4.0–5.0)
RDW: 14 % (ref 11–15)
WBC COUNT: 7.6 K/UL (ref 4.5–11.0)

## 2020-12-10 NOTE — Progress Notes
Normal mammogram results released to MyChart and routed to referring physician as requested.

## 2021-09-20 IMAGING — CT CONFORMIS_(Adult)
3 of 5 series · 12 of 33 positions shown, 14 images · non-contrast
Comparison: none

[Series 3: knee range 1.00 br60 s3 · axial · 0.42mm/px · z∈[+915,+1090]mm · 4 of 290 slices shown, 5 images]
[im 58/290  soft-tissue]
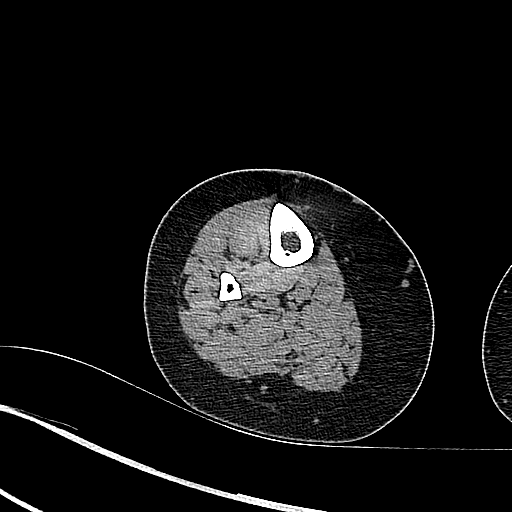
[im 58/290  bone]
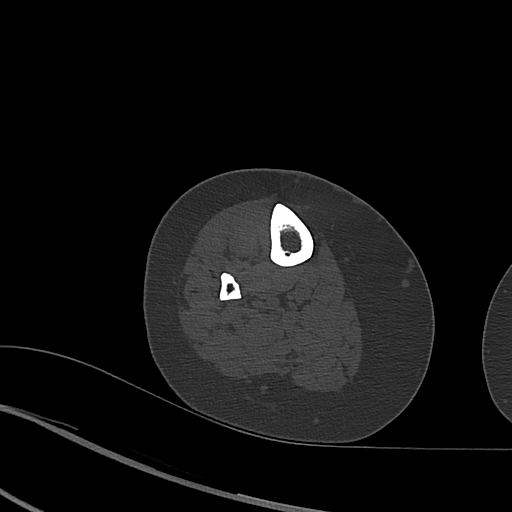
[im 116/290  bone]
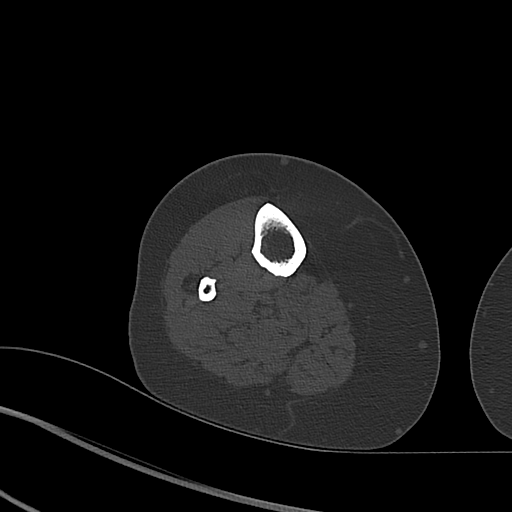
[im 174/290  bone]
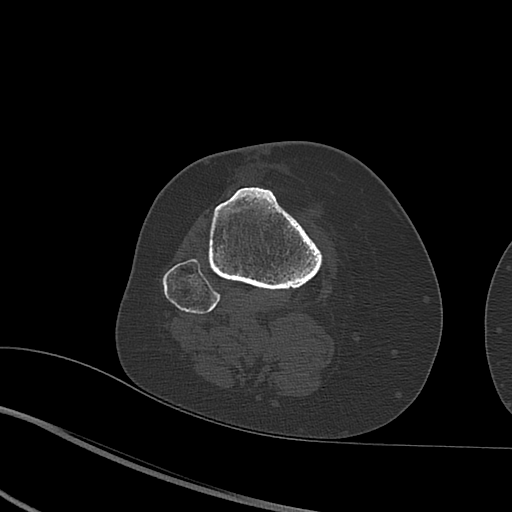
[im 232/290  bone]
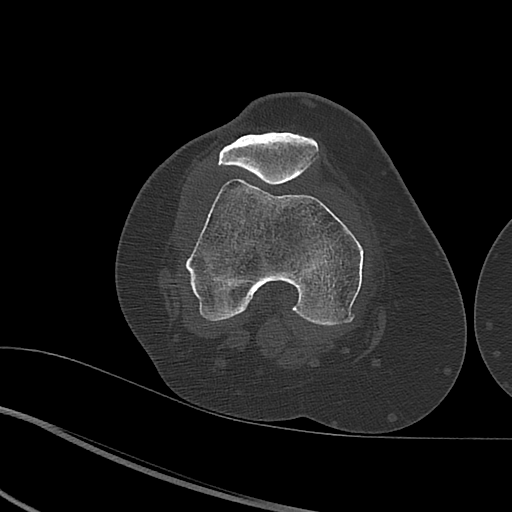

[Series 7: knee range cor 1.00 br60 s3 · coronal · 0.37mm/px · 3 of 165 slices shown]
[im 33/165  bone]
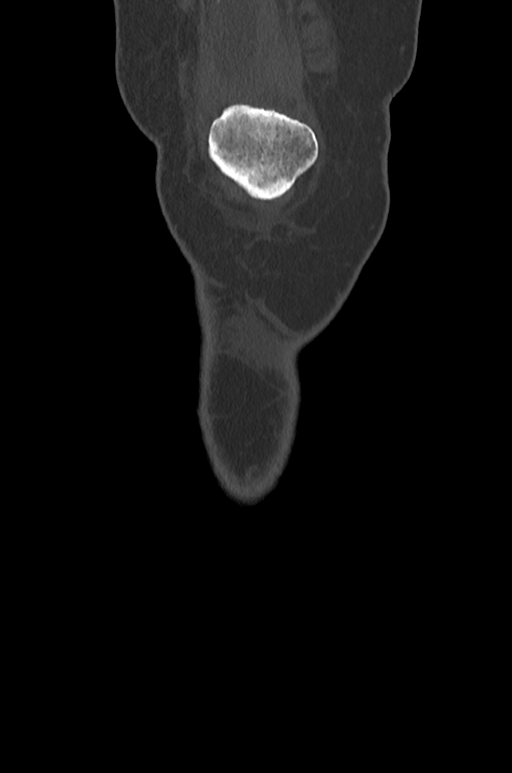
[im 66/165  bone]
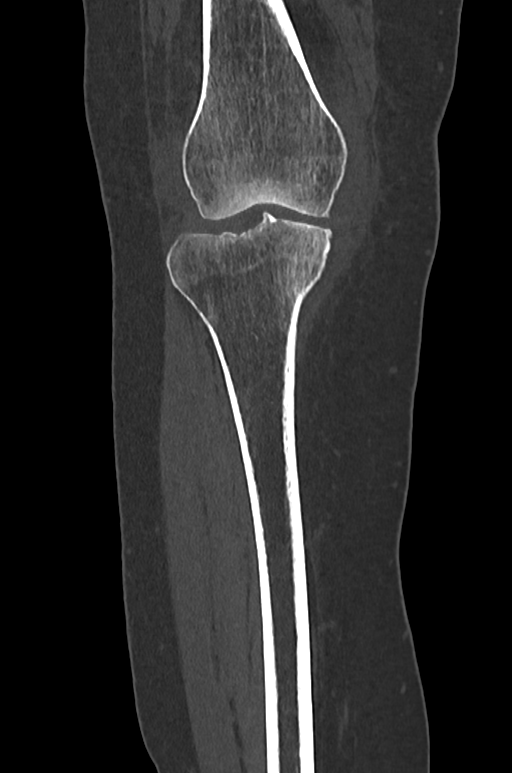
[im 99/165  bone]
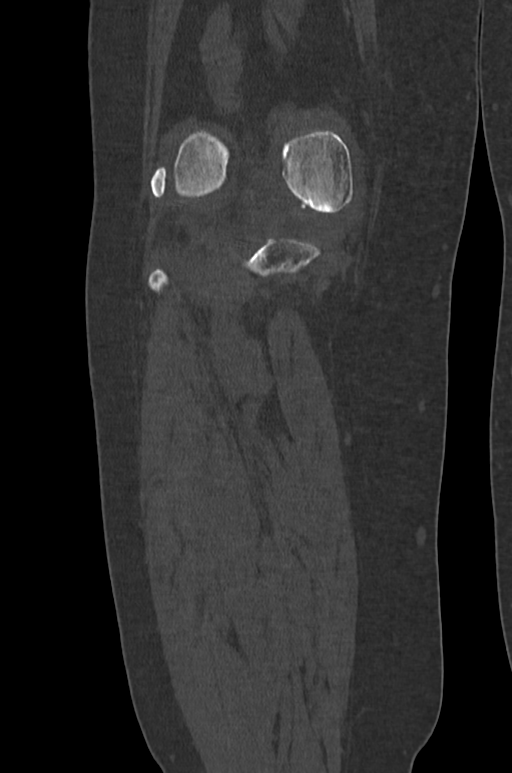

[Series 9: knee range sag 1.00 br60 s3 · sagittal · 0.33mm/px · 5 of 189 slices shown, 6 images]
[im 63/189  bone]
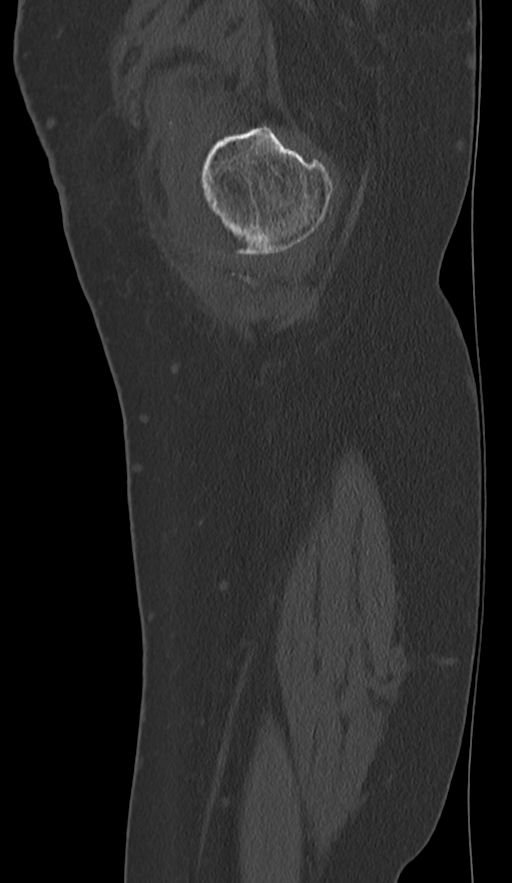
[im 79/189  bone]
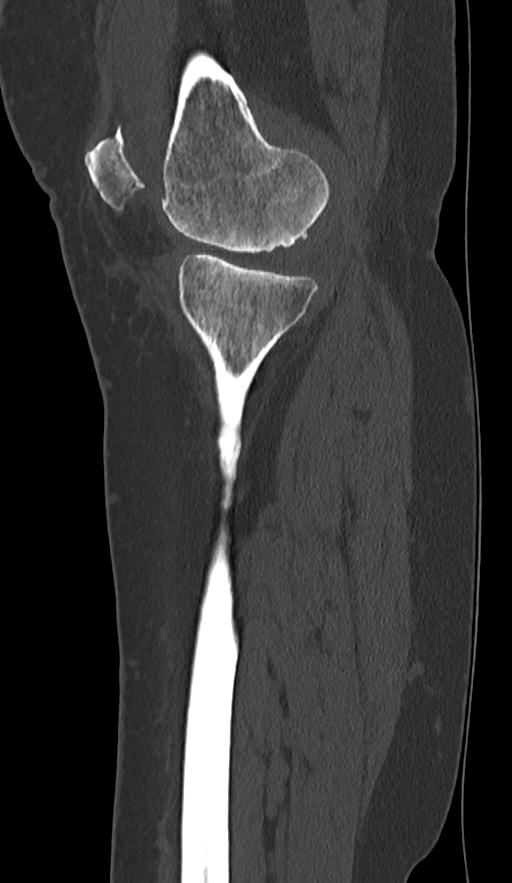
[im 95/189  soft-tissue]
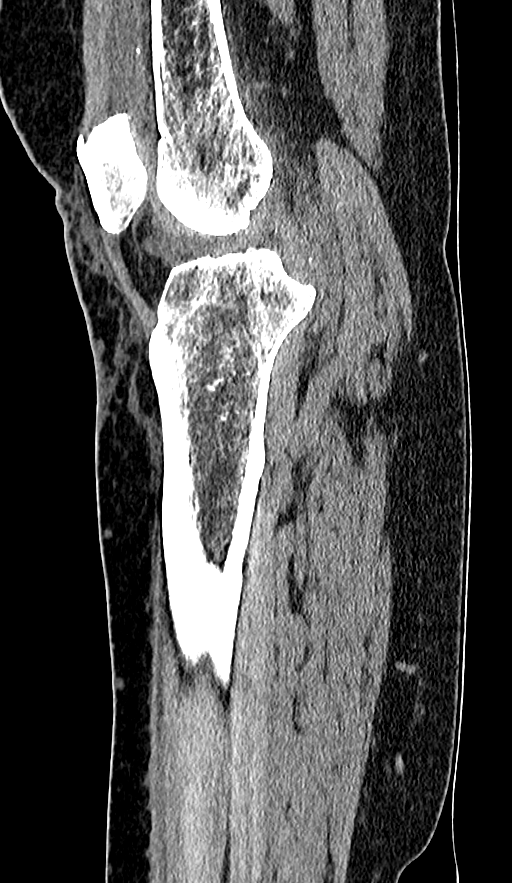
[im 95/189  bone]
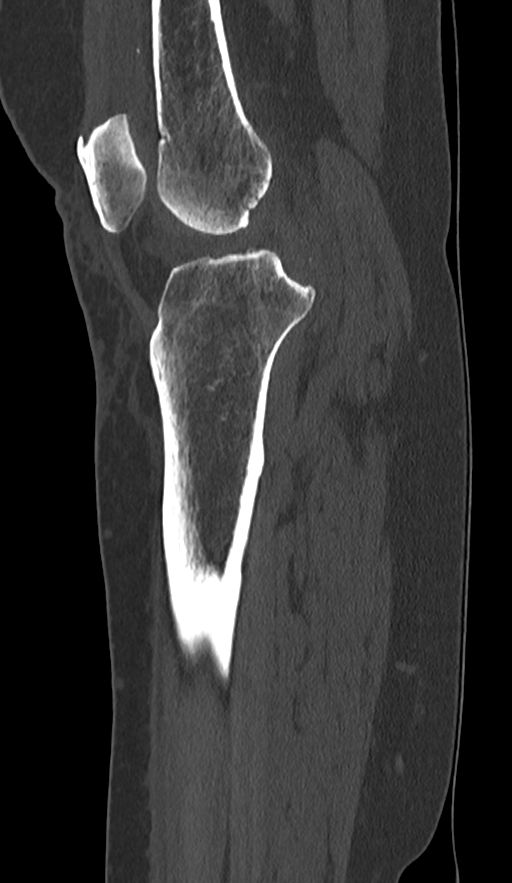
[im 110/189  bone]
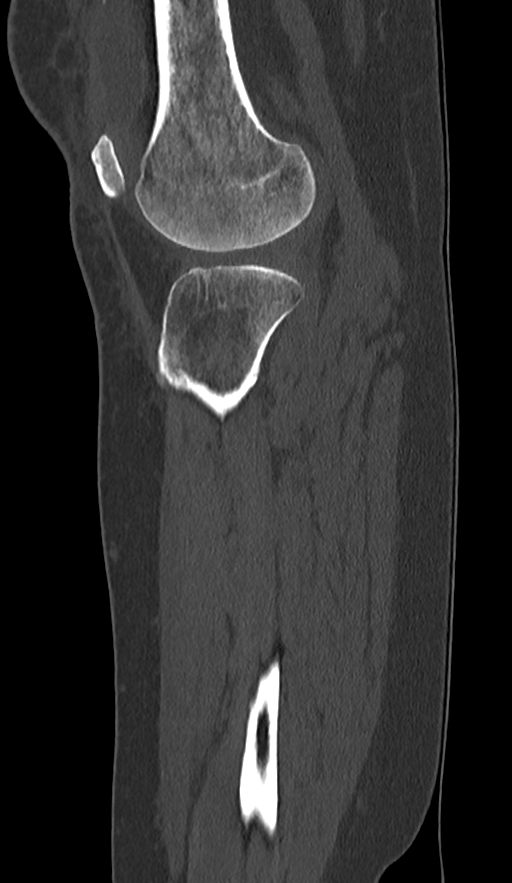
[im 126/189  bone]
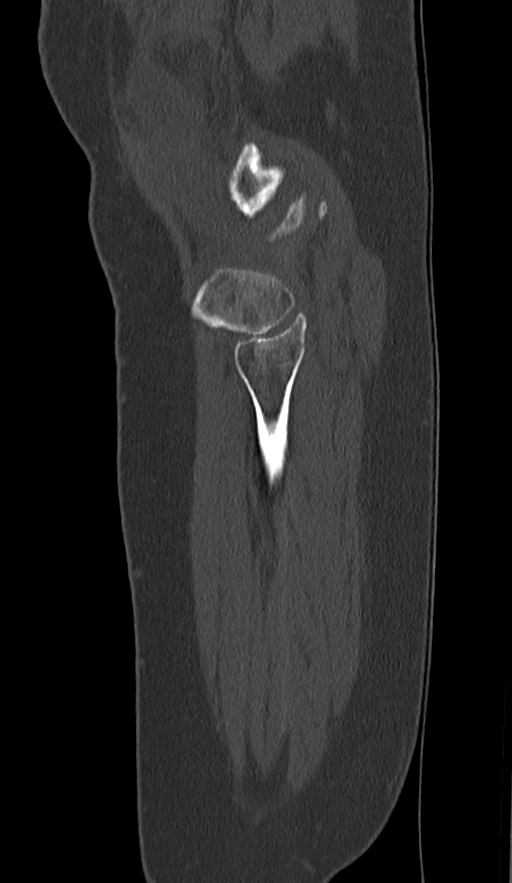

[12 of 33 positions shown; findings below may reference images not displayed]

EXAM

CT right knee, preop protocol.

INDICATION

right knee pain
CONFORMIS KNEE PROTOCOL. PT C/O RIGHT KNEE PAIN. CT/NM 0/0. TJ

FINDINGS

CT of the right knee was performed.  Images of the right knee and ankle were included in the
protocol.

There is pronounced narrowing of the medial aspect of the femorotibial joint space per there
osteophytes of the medial femoral condyle medial tibial plateau with subchondral sclerosis.  Graft
there is moderate narrowing of the patellofemoral joint space.  There are moderately sized
osteophytes of the posterior margin of the patella.  There is a moderately sized joint effusion.

There is no fracture.

There is mild osteoarthrosis of the right hip and ankle.

IMPRESSION

Preop CT protocol of the right knee demonstrating moderately pronounced osteoarthrosis and a
moderately sized joint effusion.

Tech Notes:

CONFORMIS KNEE PROTOCOL. PT C/O RIGHT KNEE PAIN. CT/NM 0/0. TJ

## 2021-11-16 IMAGING — CR LOW_EXM
2 series · 2 of 2 positions shown · non-contrast
Comparison: none

[knee ap]
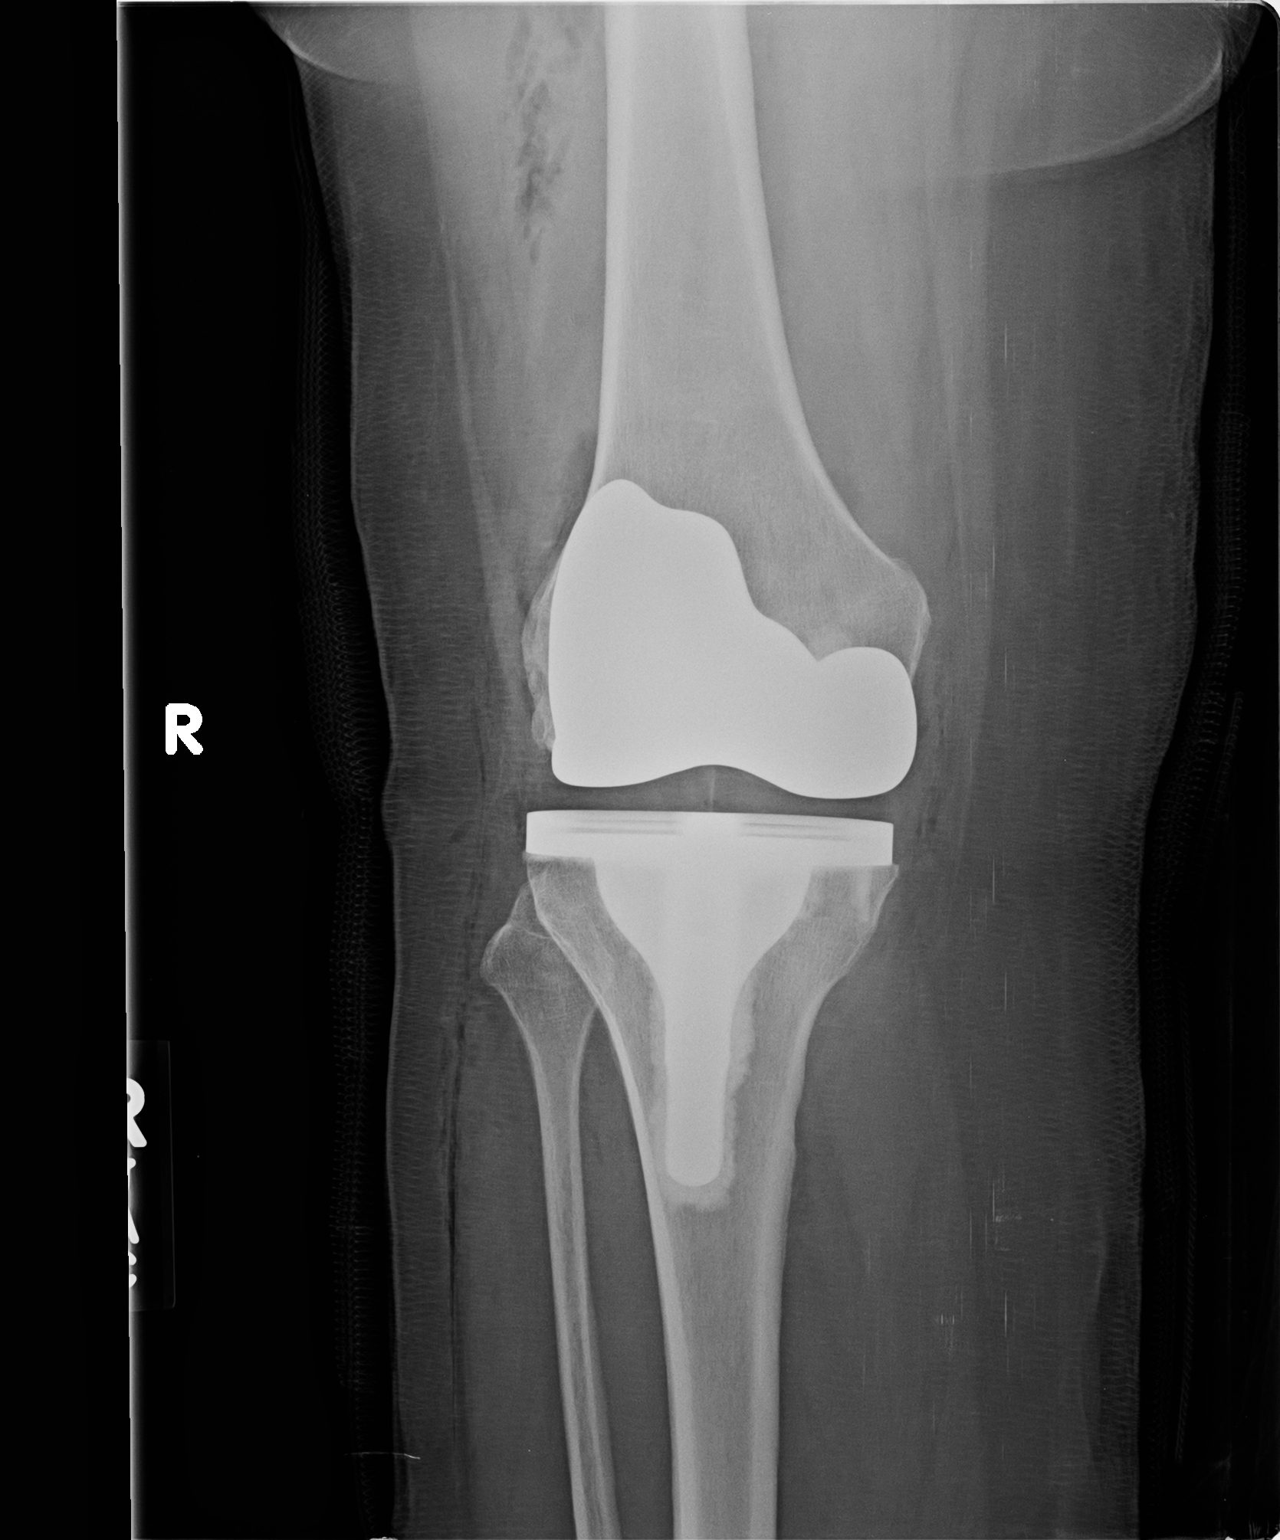

[knee lat]
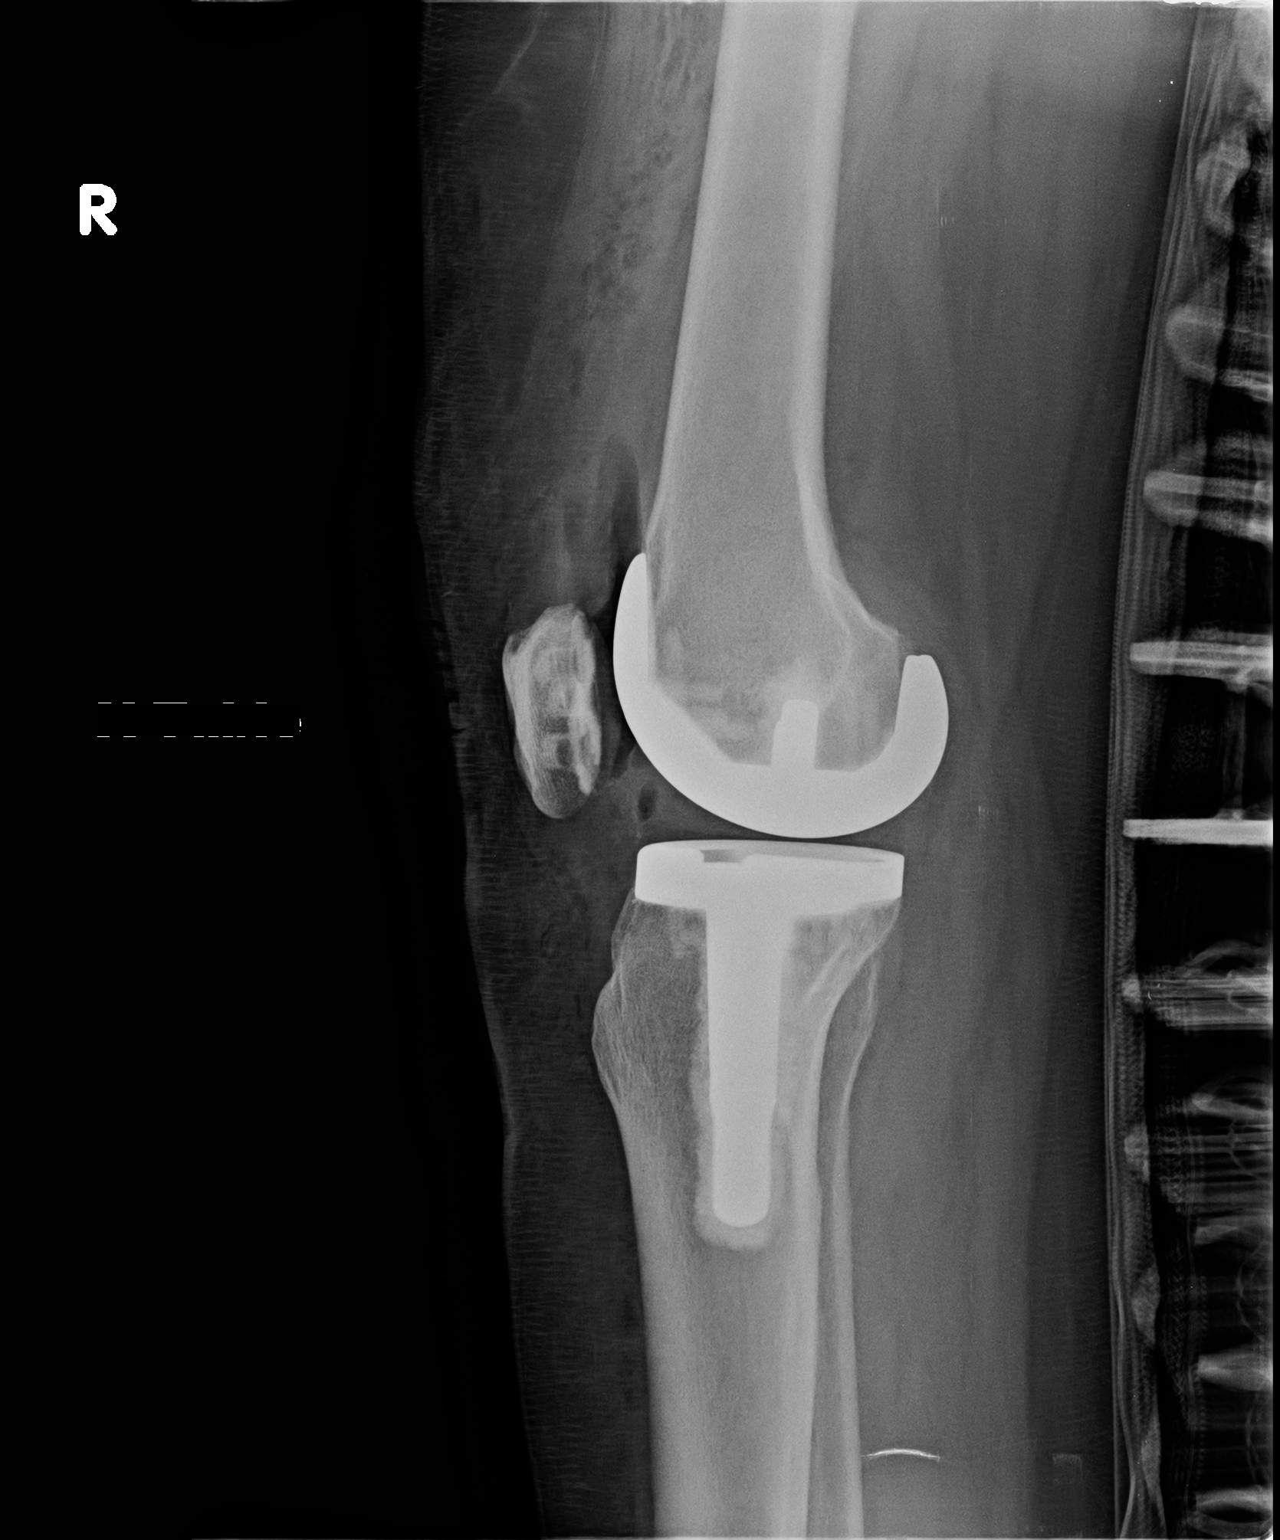

[2 of 2 positions shown; findings below may reference images not displayed]

EXAM

RADIOLOGICAL EXAMINATION, KNEE

INDICATION

s/p R TKA
S/P RT TKA. JC/TM

COMPARISONS

None

FINDINGS

Postsurgical changes demonstrate a well-positioned total right knee prosthesis. The components are
intact. They are well positioned and aligned.

IMPRESSION

Well positioned and aligned total right knee prosthesis is identified.

Tech Notes:

S/P RT TKA. JC/TM

## 2021-12-10 ENCOUNTER — Encounter: Admit: 2021-12-10 | Discharge: 2021-12-10 | Payer: MEDICARE

## 2021-12-13 ENCOUNTER — Encounter: Admit: 2021-12-13 | Discharge: 2021-12-13 | Payer: MEDICARE

## 2021-12-13 DIAGNOSIS — M85851 Other specified disorders of bone density and structure, right thigh: Secondary | ICD-10-CM

## 2021-12-13 DIAGNOSIS — R5383 Other fatigue: Secondary | ICD-10-CM

## 2021-12-13 DIAGNOSIS — Z9221 Personal history of antineoplastic chemotherapy: Secondary | ICD-10-CM

## 2021-12-13 DIAGNOSIS — Z780 Asymptomatic menopausal state: Secondary | ICD-10-CM

## 2021-12-13 DIAGNOSIS — D126 Benign neoplasm of colon, unspecified: Secondary | ICD-10-CM

## 2021-12-13 DIAGNOSIS — Z79899 Other long term (current) drug therapy: Secondary | ICD-10-CM

## 2021-12-13 DIAGNOSIS — E785 Hyperlipidemia, unspecified: Secondary | ICD-10-CM

## 2021-12-13 DIAGNOSIS — Z1231 Encounter for screening mammogram for malignant neoplasm of breast: Secondary | ICD-10-CM

## 2021-12-13 DIAGNOSIS — R7611 Nonspecific reaction to tuberculin skin test without active tuberculosis: Secondary | ICD-10-CM

## 2021-12-13 DIAGNOSIS — Z1379 Encounter for other screening for genetic and chromosomal anomalies: Secondary | ICD-10-CM

## 2021-12-13 DIAGNOSIS — M1990 Unspecified osteoarthritis, unspecified site: Secondary | ICD-10-CM

## 2021-12-13 DIAGNOSIS — H409 Unspecified glaucoma: Secondary | ICD-10-CM

## 2021-12-13 DIAGNOSIS — Z13820 Encounter for screening for osteoporosis: Secondary | ICD-10-CM

## 2021-12-13 DIAGNOSIS — Z08 Encounter for follow-up examination after completed treatment for malignant neoplasm: Secondary | ICD-10-CM

## 2021-12-13 DIAGNOSIS — Z9189 Other specified personal risk factors, not elsewhere classified: Secondary | ICD-10-CM

## 2021-12-13 DIAGNOSIS — Z853 Personal history of malignant neoplasm of breast: Secondary | ICD-10-CM

## 2021-12-13 DIAGNOSIS — F419 Anxiety disorder, unspecified: Secondary | ICD-10-CM

## 2021-12-13 DIAGNOSIS — I10 Essential (primary) hypertension: Secondary | ICD-10-CM

## 2021-12-13 LAB — COMPREHENSIVE METABOLIC PANEL
ALBUMIN: 3.8 g/dL (ref 3.5–5.0)
ALK PHOSPHATASE: 73 U/L — ABNORMAL LOW (ref 25–110)
ALT: 15 U/L (ref 7–56)
ANION GAP: 7 K/UL (ref 3–12)
AST: 13 U/L (ref 7–40)
BLD UREA NITROGEN: 15 mg/dL (ref 7–25)
CALCIUM: 10 mg/dL (ref 8.5–10.6)
CHLORIDE: 102 MMOL/L (ref 98–110)
CO2: 28 MMOL/L (ref 21–30)
CREATININE: 0.8 mg/dL (ref 0.4–1.00)
EGFR: >60 mL/min (ref >60)
GLUCOSE,PANEL: 108 mg/dL — ABNORMAL HIGH (ref 70–100)
SODIUM: 137 MMOL/L (ref 137–147)
TOTAL BILIRUBIN: 0.5 mg/dL (ref 0.3–1.2)
TOTAL PROTEIN: 6.8 g/dL (ref 6.0–8.0)

## 2021-12-13 LAB — CBC AND DIFF
ABSOLUTE BASO COUNT: 0.1 K/UL (ref 0–0.20)
ABSOLUTE EOS COUNT: 0.1 K/UL (ref 0–0.45)
ABSOLUTE MONO COUNT: 0.7 K/UL (ref 0–0.80)
WBC COUNT: 9.1 K/UL (ref 4.5–11.0)

## 2021-12-13 LAB — 25-OH VITAMIN D (D2 + D3): VITAMIN D (25-OH) TOTAL: 58 ng/mL (ref 30–80)

## 2021-12-13 NOTE — Patient Instructions
Meliss,     Thank you for coming to see us today.   Please call our office or send a message through MyChart if you have any questions or concerns.                                                                                                                                                           Reeder Brisby RN, BSN, OCN, CBCN, CN-BN  Clinical Nurse Coordinator for Lori Ranallo, APRN-BC        [facility]   [phone] (nurse phone)  [phone] (URGENT NEED or After-Hours Line - Answered 24/7)  [phone] (fax)      [facility]   Clinic: Monday, Thursday, Friday   Admin: Tuesday  [address]  [place], [place] [address]  Scheduling: [phone]  Fax: [phone]   [facility]   Clinic: Wednesday  [address]  [place], [place] [address]  Scheduling: [phone]                         [phone]                         [phone]   Fax: [phone] or [phone]

## 2021-12-13 NOTE — Progress Notes
Name: Tiffany Burton          MRN: [id number]      DOB: [birth year]      AGE: 71 y.o.   DATE OF SERVICE: 12/13/2021    Breast Cancer (SV[facility])    Tiffany Burton is a 71 y.o. female     DIAGNOSIS: Right breast CA, 10/30/2008 (age 12)    STAGE: cT2N1M0, Stage IIB; ER+ PR+ HER-2/neu negative; Grade 3. Post neoadjuvant pathologic staging ypT1aN1(mic)M0    TREATMENT SUMMARY:  Ms. Tiffany Burton is a 71 year old female who underwent rountine screening mammogram on 10/21/2008 at [facility] which demonstrated a spiculated density in the UOQ of the right breast (BIRADS Category 0). Additional imaging was recommended and the patient underwent a diagnostic mammogram of the right breast with ultrasound which were read as BIRADS Category 4. These imaging studies revealed a 10 o'clock lesion in the right breast measuring 1.6 x 1.3 x 1.3cm. CNBX was performed on 10/27/2008 and demonstrated invasive ductal carcinoma, Grade 3, ER 100%, PR 22%, Ki-67 3%, HER-2/neu 0+ by IHC, p53 0%, EGFR 0%. She then sought an opinion with Dr. Aram Beecham at [facility] and repeat breast imaging was performed. Ultrasound on 11/10/2008 revealed an increase in the right breast mass 10 o'clock position 7cm FTN measuring 2.5 x 2.7 x 1.8cm. In addition there was a suspicious appearing right axillary lymph node identified measuring 0.9 x 1.8 x 0.7cm. Biopsy of the lymph node 11/10/2008 demonstrated metastatic carcinoma, consistent with ductal carcinoma of the breast with predictive markers of ER 99%, PR 15%, Ki-67 7%, HER-2/neu 0+ by IHC and FISH 1.0. MRI was also performed on 11/10/2008 and the right breast carcinoma measured 2.5 x 3.5 x 3.9 cm. The patient had a portacath placed by Dr. Fredricka Bonine 11/20/2008.   ?  Neoadjuvant chemotherapy was recommended; the patient completed Epirubicin/Cyclophosphamide x 4 cycles (11/20/2008 - 12/31/2008) and Taxotere 75mg /m2 every 3 weeks (01/14/2009- 03/18/2009).   She underwent right modified radical mastectomy 04/14/2009 Fredricka Bonine). Pathology showed residual  0.2cm, grade 2, IDC with ALND (6/15) positive lymph nodes.  Prognostic markers on mastectomy: ER 100%, PR 0%, HER-2 0+, Ki-67 35%. Prognostic markers on LN: ER 100%, PR 0%, HER-2 0+, Ki-67 1%. Right chest wall radiation (Dr. Joycelyn Man) completed 07/16/2009. She then started antihormone therapy with femara (06/2009) and received bisphosphonate therapy with Zometa 07/17/2009 and completed 2 years of therapy (09/07/2011).  Port removed on 06/15/10.  ?  PRESENT THERAPY:  Currently on letrozole (06/2009); will completed 07/2019.   ?  Medical Team:   Surgeon: Aram Beecham, MD  [facility]  [address] [place], [place] [address]  [phone]  ?  Medical oncologist: Lowella Petties, MD  [facility]  [address] [place], [place] [address]  [phone]  ?  Radiation oncologist: Joycelyn Man, MD  [facility]  [address]  [place], [place] [address]  [phone]    Family History   Problem Relation Age of Onset   ? Coronary Artery Disease Mother    ? Glaucoma Mother    ? Dementia Father    ? COPD Father    ? None Reported Sister         Lives in [place]   ? Cancer-Prostate Brother 58   ? Cancer Paternal Grandmother         unknown primary-tumor between spine & esophagus   ?  Cancer-Hematologic Paternal Grandmother         lymphoma   ? None Reported Daughter    ? None Reported Son    ? Cancer-Ovarian Paternal Aunt 69   ? Cancer-Pancreas Paternal Uncle 36   ? Cancer-Lung Paternal Uncle        GENETIC TESTING:   [employer] testing: Common Hereditary Cancers Panel 05/10/2017: VUS RAD50 c.1336A>G (p.Lys446Glu)  12/13/2021 Verification of status of VUS through [employer]. Remains variant of uncertain significance.    HISTORY OF PRESENT ILLNESS:   The patient returns to the clinic today for continued follow-up in the Breast Cancer Survivorship Clinic. She has a history of right breast CA, 10/30/2008 (57). Stage IIB; ER+ PR+ HER-2/neu negative; Grade 3. Post neoadjuvant pathologic staging ypT1aN1(mic)M0. Post mastectomy radiation therapy (Dr. Joycelyn Man) completed 07/16/2009. She then started antihormone therapy with femara (06/2009) and received bisphosphonate therapy with Zometa 07/17/2009 and completed 2 years of therapy (09/07/2011).  Port removed on 06/15/10. Continues on letrozole (06/2009); will complete in 10 years (06/2019). She is feeling well, denies changes on the right chest wall, denies left breast changes.       Review of Systems   Significant fatigue is a chronic problem.   Now established with Fredia Sorrow, MD (PCP); [place], [place].   Her husband Ree Kida) was diagnosed with A-FIB and had (cardiac ablation in 06/2015; Dr. Wallene Huh, cardiology [facility])  Cardioversion in 07/2015. Nickolas Madrid, MD @ [facility]). 07/2016; cardiac ablation x 2. Holter monitor x one week. 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   Femara completed 07/2019; feels more energetic.   COVID vaccination completed (Moderna; 05/09/2019 and 06/06/2019).   COVID booster 03/05/2020; Moderna.   [employer] testing: Common Hereditary Cancers Panel 05/10/2017: VUS RAD50 c.1336A>G (p.Lys446Glu)  Weight (BMI 36.94; # 195 <- 36.58; # 196 <- 35.31; # 189 <- 36.47; # 190 <- 36.31; # 194 <- 31.55 <- 33.52 <- 34.0 <- 35.3 <- 34.4 <- 34.12 <- 34.29 <- 34.30).   No SOB, cough or swelling of the extremities.   No chest pain or palpitations.  No changes in the right chest wall. Denies left breast changes.   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   No weakness. No skin rash.   No nausea, vomiting, diarrhea, constipation or abdominal pain.  Colonoscopy 2014 (tubular adenoma). Repeat 03/2018; polyp high grade dysplasia. Repeat recommended 03/2019. She had a repeat colonoscopy 04/2019; small polyp. Repeat 3 years Marge Duncans).  Due 04/2022  No FH colon CA (father did have polyps).    No dysuria or hematuria. Occasional hematochezia (hemmorhoid).   Frequent urination.  Post menopausal; uterus and ovaries intact. No bleeding.   Occasional hot flashes; tolerable. No vaginal dryness.   Has not restarted YOGA, enjoys reading so she does not exercise much.    No depression or anxiety.       Objective:         ? ACETAMINOPHEN (TYLENOL PO) Take  by mouth as Needed.   ? amLODIPine (NORVASC) 10 mg tablet Take one tablet by mouth every morning.   ? brimonidine(+) (ALPHAGAN) 0.2 % OP ophthalmic solution Apply one drop to both eyes twice daily.   ? CALCIUM CITRATE/VITAMIN D3 (CALCIUM CITRATE + D PO) Take 630 mg by mouth twice daily.   ? cholecalciferol (vitamin D3) 100 mcg (4,000 unit) cap Take one-quarter capsule by mouth daily.   ? dorzolamide (TRUSOPT) 2 % ophthalmic solution Apply one drop to left eye as directed  twice daily.   ? IBUPROFEN PO Take  by mouth as Needed.   ? latanoprost (XALATAN) 0.005 % ophthalmic solution Place one drop into or around eye(s) at bedtime daily.   ? loratadine (CLARITIN) 10 mg PO tablet Take one tablet by mouth daily.   ? MULTIVITAMIN PO Take  by mouth.   ? PARoxetine HCL (PAXIL) 20 mg tablet Take one tablet by mouth at bedtime daily.   ? simvastatin (ZOCOR) 40 mg tablet Take one tablet by mouth at bedtime daily.   ? timolol (TIMOPTIC) 0.5 % OP ophthalmic solution Apply one drop to both eyes twice daily.   ? [employer] .MEDSUPPLY     Vitals:    12/13/21 1438 12/13/21 1441   BP: 132/69    BP Source: Arm, Left Upper    Pulse: 85    Temp: 36.7 ?C (98.1 ?F)    Resp: 16    SpO2: 97%    TempSrc: Oral    PainSc:  Zero   Weight: 88.6 kg (195 lb 6.4 oz)    Height: 154.9 cm (5' 0.98)      Body mass index is 36.94 kg/m?Marland Kitchen        Pain Addressed:  N/A     Fatigue Scale:0    Patient Evaluated for a Clinical Trial: No treatment clinical trial available for this patient.     [place] Cooperative Oncology Group performance status is 0, Fully active, able to carry on all pre-disease performance without restriction.Marland Kitchen     Physical Exam  Vitals reviewed. Chest:       Lymphadenopathy:      Cervical: No cervical adenopathy.      Upper Body:      Right upper body: No supraclavicular adenopathy.      Left upper body: No supraclavicular adenopathy.     This is a 71 year old female in no acute distress; well-developed, well-nourished.   [employer] testing: Common Hereditary Cancers Panel 05/10/2017: VUS RAD50 c.1336A>G (p.Lys446Glu)  HEENT: No icterus  Neck: No JVD, supple.   Chest: CTA bilaterally.   CV: RRR without murmur.   Abdomen: Soft, non-distended, non-tender, positive bowel sounds, no organomegaly.    Skin: No rash.   Back: No tenderness with palpation or percussion over the spine.   Extremitites: No clubbing, cyanosis or edema.   CN: II-XII grossly intact; no sensory or motor abnormalities.        Lab Results   Component Value Date/Time    CA2729 26.1 06/01/2015 01:05 PM     Lab [facility] 12/02/2015:   CBC/chemistry: Glucose 121  Otherwise within normal limits    Received lab 03/13/2020:   Vitamin D drawn 02/27/2020: (66) from [facility] in [place], [place]. Continue taking Vitamin D3 (4,000IU) daily.        SURGICAL PATHOLOGY 04/14/2009:   Final Diagnosis:  A. 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    B. Lymph node (1), sentinel lymph node, biopsy:  Metastatic carcinoma, consistent with metastatic ductal carcinoma (1/1) evident on pan-cytokeratin staining.    C. Lymph node (1), sentinel lymph node, biopsy:   Metastatic carcinoma, consistent with metastatic ductal carcinoma (1/1).    D. Lymph nodes (3), sentinel lymph node, biopsy:   There is no evidence of malignancy (0/3).  The diagnosis is supported by negative immunohistochemical staining for pan-cytokeratin.    E. Fibroadipose tissue, additional level 1 right axillary tissue, biopsy:  Benign soft tissue.  There is no evidence of malignancy.     F. Fibroadipose tissue, additional level 2 right axillary, biopsy:  Benign soft tissue.  There is no evidence of malignancy.     G. Breast tissue and lymph nodes (7), right breast with level 1 and 2 axillary dissection, mastectomy:   Residual invasive ductal carcinoma, nuclear grade 2. See comment.  Metastatic carcinoma involving two lymph nodes (2/7).     Comment:  INVASIVE CARCINOMA OF THE BREAST STATUS POST NEOADJUVANT THERAPY  Specimen Type: Mastectomy   Laterality: Right  Tumor Site: Upper outer quadrant   Tumor Bed Identified: Yes  Size/Extent of Tumor Bed: 3.6 x 3.4 x 2.0 cm  Size/Extent of Residual Invasive Tumor: 0.2cm in aggregate dimension  Average Residual Viable Cancer Cellularity of the Tumor Bed: 2%   Histologic Type: Invasive ductal carcinoma  Histologic Grade (Nottingham Histologic Score): moderate differentiated,  II/III  Tubule Formation: 3  Nuclear Grade: 2  Mitotic Count (40x objective): 1  Total Nottingham Score: 6/9  Surgical Margins: 1.0cm from closest margin (deep margin)   Ductal Carcinoma In-situ (DCIS): Absent  Lobular Carcinoma In-situ (LCIS): Absent  Lymph-Vascular Invasion: Identified   Perineural Invasion: Not identified  Tumor Necrosis: Not identified  Nipple Involvement: Not identified  Skin Involvement: Not identified  Lymph Node Sampling: sentinel lymph node with axillary dissection  Total number of involved nodes/total nodes found: 6/15   Size of largest metastasis: 0.1cm  Extranodal extension: Not identified  Non-neoplastic Breast Tissue: Fibrosis  Overall Response to Neoadjuvant Therapy:  In the Breast: Partial  In the Lymph Nodes: Partial  Prognostic markers: See Chartmaxx for addendum Image Analysis report  Time between tumor removal and placement into formalin < 1 hour: Yes  Fixation Time between 6-48 hours: Yes  Pathologic Staging:   ypT1a, N52mi    CBC w diff    Lab Results   Component Value Date/Time    WBC 9.1 12/13/2021 12:18 PM    RBC 4.67 12/13/2021 12:18 PM    HGB 13.8 12/13/2021 12:18 PM    HCT 42.1 12/13/2021 12:18 PM    MCV 90.0 12/13/2021 12:18 PM    MCH 29.5 12/13/2021 12:18 PM    MCHC 32.8 12/13/2021 12:18 PM    RDW 14.1 12/13/2021 12:18 PM    PLTCT 269 12/13/2021 12:18 PM    MPV 8.1 12/13/2021 12:18 PM    Lab Results   Component Value Date/Time    NEUT 74 12/13/2021 12:18 PM    ANC 6.80 12/13/2021 12:18 PM    LYMA 15 (L) 12/13/2021 12:18 PM    ALC 1.30 12/13/2021 12:18 PM    MONA 8 12/13/2021 12:18 PM    AMC 0.70 12/13/2021 12:18 PM    EOSA 2 12/13/2021 12:18 PM    AEC 0.10 12/13/2021 12:18 PM    BASA 1 12/13/2021 12:18 PM    ABC 0.10 12/13/2021 12:18 PM        Comprehensive Metabolic Profile    Lab Results   Component Value Date/Time    NA 137 12/13/2021 12:18 PM    K 3.6 12/13/2021 12:18 PM    CL 102 12/13/2021 12:18 PM    CO2 28 12/13/2021 12:18 PM    GAP 7 12/13/2021 12:18 PM    BUN 15 12/13/2021 12:18 PM    CR 0.84 12/13/2021 12:18 PM    GLU 108 (H) 12/13/2021 12:18 PM    Lab Results   Component Value Date/Time    CA  10.1 12/13/2021 12:18 PM    ALBUMIN 3.8 12/13/2021 12:18 PM    TOTPROT 6.8 12/13/2021 12:18 PM    ALKPHOS 73 12/13/2021 12:18 PM    AST 13 12/13/2021 12:18 PM    ALT 15 12/13/2021 12:18 PM    TOTBILI 0.5 12/13/2021 12:18 PM    GFR >60 12/26/2019 11:01 AM    GFRAA >60 12/26/2019 11:01 AM          SCANS:   CT CHEST/ABDOMEN 12/02/2009:   CHEST:   1. STATUS POST RIGHT MASTECTOMY AND AXILLARY LYMPH NODE DISSECTION WITHOUT EVIDENCE OF PULMONARY METASTATIC DISEASE OR THORACIC LYMPHADENOPATHY.  ABDOMEN AND PELVIS:  1. NO EVIDENCE OF HEPATIC METASTATIC DISEASE OR ABDOMINOPELVIC LYMPHADENOPATHY.   BONE SCAN 12/02/2009:  NO SCINTIGRAPHIC EVIDENCE OF OSSEOUS METASTATIC DISEASE   BONE SCAN 12/01/2010:  NO SCINTIGRAPHIC EVIDENCE OF OSSEOUS METASTATIC DISEASE.   12/14/2011 BONE SCAN:   DEGENERATIVE CHANGES OF THE AXIAL AND APPENDICULAR SKELETON AS DESCRIBED WITHOUT EVIDENCE OF OSSEOUS METASTASIS.   12/01/2010 CHEST X-RAY:  STATUS POST RIGHT MASTECTOMY AND AXILLARY LYMPH NODE DISSECTION WITHOUT EVIDENCE OF ACUTE DISEASE IN THE CHEST.   12/14/2011 CHEST X-RAY:   NO ACUTE DISEASE IN THE CHEST.   ?  BREAST IMAGING:   12/09/2013 LEFT MAMMOGRAM: ASSESSMENT: BIRAD 1-NEGATIVE  12/22/2014 LEFT MAMMOGRAM:  ASSESSMENT: BIRAD 1 - NEGATIVE  12/07/2015 LEFT MAMMOGRAM: ACR BI-RADS? Assessments: BIRAD 1-Negative  12/08/2016 LEFT MAMMOGRAM:  ACR BI-RADS? Assessments: BIRAD 1-Negative  12/14/2017 LEFT MAMMOGRAM: ACR BI-RADS? Assessments: BIRAD 1-Negative  12/20/2018 LEFT MAMMOGRAM:  ASSESSMENT: BIRAD 1-Negative   12/26/2019 LEFT MAMMOGRAM:  ASSESSMENT: BIRAD 1-Negative   12/10/2020 LEFT MAMMOGRAM:  ASSESSMENT: BIRAD 1-Negative   12/13/2021 LEFT MAMMOGRAM:  ASSESSMENT:   Left: 1 - Negative   Overall: 1 - Negative     BONE HEALTH:   06/01/2015 BMD:  Improvement since 2016 evaluation; mild osteopenia.   12/14/2017 BMD: Stable bone mineral density with persistent low bone mass (osteopenia).   12/26/2019 BMD:  Persistent low bone mass (osteopenia) without significant overall change since 2019.   12/13/2021 BMD: Persistent low bone mass (osteopenia).   LEFT FEMORAL NECK ?   Current: 0.872 g/cm2, T-score of -1.2 ? ?   Previous: 0.856 g/cm2, T-score of -1.3     LEFT TOTAL HIP ?   Current: 0.967 g/cm2, T-score of -0.3 ?   Previous: 0.955 g/cm2, T-score of -0.4     RIGHT FEMORAL NECK   Current: 0.834 g/cm2, T-score of -1.5 ? ?   Previous: 0.835 g/cm2, T-score of -1.5     RIGHT TOTAL HIP   Current: 0.901 g/cm2, T-score of -0.8   Previous: 0.900 g/cm2, T-score of -0.9 ?     FRAX SCORE   10 year risk hip fracture = 1.5%   10 year risk major osteoporotic fracture = 7.4%       Vitamin D (66) from [facility] in [place], [place]. Continue taking Vitamin D3 (4,000IU) daily.     ASSESSMENT AND PLAN:   1. Right breast CA, 10/2008. Clinical stage T2N1M0; IIB (ER/PR positive, HER-2 negative Grade 3, low PR probable luminal B). Completed 4 cycles of EC followed by 4 cycles of taxotere eoadjuvantly and underwent modified radical mastectomy 04/14/2009 with pathologic staging ypT1aN1(mic)M0.She has completed right chest wall radiation therapy under Dr. Darcus Austin on 07/16/2009. Now on Femara since 07/17/2009. Patient is tolerating endocrine therapy well. Completed AI therapy (letrozole) in 07/2019.   2. History of right upper extremetry DVT:  She was initially on warfarin and managed  by PCP in [place], [place]. She has completed anti-coagulation therapy. No further problems.   3. Genetic testing: [employer] testing: Common Hereditary Cancers Panel 05/10/2017: VUS RAD50 c.1336A>G (p.Lys446Glu) 12/13/2021 Verification of status of VUS through [employer]. Remains variant of uncertain significance.  4. Patient completed 2 years of  Zometa 08/2011. BMD 05/2013; stable mild osteopenia. Repeat 06/23/2014 showed moderate osteopenia. Repeat 05/2015; improvement to mild osteopenia. Repeat 12/14/2017; stable osteopenia. Repeat BMD 12/26/2019; stable osteopenia. Repeat 12/13/2021:  Stable osteopenia with low FRAX. Repeat in 2 years Due 2025  5. Reviewed signs to watch for that could indicate a local or distant recurrence. Patient will call our office with any new signs.   Local recurrence  Signs and symptoms of local recurrence following mastectomy/reconstruction may include:   ? One or more painless nodules on or under the skin of your chest wall   ? A new area of thickening along or near the mastectomy scar  Regional recurrence   A regional breast cancer recurrence means the cancer has come back in the lymph nodes in your armpit or collarbone area. Signs and symptoms of regional recurrence may include:   ? A lump or swelling in the lymph nodes under your arm or in the groove above your collarbone   ? Swelling of your arm (this could be related to lymphedema or even a blood clot)  ? Persistent pain in your arm and shoulder   ? Increasing loss of sensation in your arm and hand  Distant (metastatic) recurrence   A distant, or metastatic, recurrence means the cancer has traveled to distant parts of the body, most commonly the bones, liver and lungs. The signs and symptoms may include:   ? Pain, such as chest or bone pain   ? Persistent, dry cough   ? Difficulty breathing   ? Loss of appetite   ? Persistent nausea, vomiting or weight loss   ? Swelling in the abdomen  ? Severe headaches  When to call our office   You know your body best -- what feels normal and what doesn't. It's important to be aware of the signs and symptoms of recurrent breast cancer, such as:   ? New and persistent pain   ? Changes or new lumps in your breast or surgical scar or chest wall   ? Weight loss   ? Shortness of breath  If you experience any signs and symptoms that might suggest a recurrence, call our office. Patient verbalized understanding and phone numbers provided.    6. History of glaucoma. Dr. Mignon Pine [place], [place]).  7. RTC 12 months with lab and left screening mammogram.      Total Time Today was 40 minutes in the following activities: Preparing to see the patient, Obtaining and/or reviewing separately obtained history, Performing a medically appropriate examination and/or evaluation, Counseling and educating the patient/family/caregiver, Ordering medications, tests, or procedures, Referring and communication with other health care professionals (when not separately reported), Documenting clinical information in the electronic or other health record, Independently interpreting results (not separately reported) and communicating results to the patient/family/caregiver and Care coordination (not separately reported)      Ernie Hew, APRN-BC, CBCN  Nurse Practitioner  Collaborating physician: Dellia Cloud, MD  NPI # [id number]    Sharia Reeve, APRN-NP

## 2022-11-25 ENCOUNTER — Encounter: Admit: 2022-11-25 | Discharge: 2022-11-25 | Payer: MEDICARE

## 2022-11-25 NOTE — Progress Notes
Name: Tiffany Burton          MRN: [id number]      DOB: 14-Jul-1950      AGE: 72 y.o.   DATE OF SERVICE: 12/01/2022    Breast Cancer and Follow Up ([facility])    Tiffany Burton is a 72 y.o. female     DIAGNOSIS: Right breast CA, 10/30/2008 (age 58)    STAGE: cT2N1M0, Stage IIB; ER+ PR+ HER-2/neu negative; Grade 3. Post neoadjuvant pathologic staging ypT1aN1(mic)M0    TREATMENT SUMMARY:  Tiffany Burton is a 72 year old female who underwent rountine screening mammogram on 10/21/2008 at [facility] which demonstrated a spiculated density in the UOQ of the right breast (BIRADS Category 0). Additional imaging was recommended and the patient underwent a diagnostic mammogram of the right breast with ultrasound which were read as BIRADS Category 4. These imaging studies revealed a 10 o'clock lesion in the right breast measuring 1.6 x 1.3 x 1.3cm. CNBX was performed on 10/27/2008 and demonstrated invasive ductal carcinoma, Grade 3, ER 100%, PR 22%, Ki-67 3%, HER-2/neu 0+ by IHC, p53 0%, EGFR 0%. She then sought an opinion with Dr. Aram Beecham at [facility] and repeat breast imaging was performed. Ultrasound on 11/10/2008 revealed an increase in the right breast mass 10 o'clock position 7cm FTN measuring 2.5 x 2.7 x 1.8cm. In addition there was a suspicious appearing right axillary lymph node identified measuring 0.9 x 1.8 x 0.7cm. Biopsy of the lymph node 11/10/2008 demonstrated metastatic carcinoma, consistent with ductal carcinoma of the breast with predictive markers of ER 99%, PR 15%, Ki-67 7%, HER-2/neu 0+ by IHC and FISH 1.0. MRI was also performed on 11/10/2008 and the right breast carcinoma measured 2.5 x 3.5 x 3.9 cm. The patient had a portacath placed by Dr. Fredricka Bonine 11/20/2008.      Neoadjuvant chemotherapy was recommended; the patient completed Epirubicin/Cyclophosphamide x 4 cycles (11/20/2008 - 12/31/2008) and Taxotere 75mg /m2 every 3 weeks (01/14/2009- 03/18/2009).   She underwent right modified radical mastectomy 04/14/2009 Fredricka Bonine). Pathology showed residual  0.2cm, grade 2, IDC with ALND (6/15) positive lymph nodes.  Prognostic markers on mastectomy: ER 100%, PR 0%, HER-2 0+, Ki-67 35%. Prognostic markers on LN: ER 100%, PR 0%, HER-2 0+, Ki-67 1%. Right chest wall radiation (Dr. Joycelyn Man) completed 07/16/2009. She then started antihormone therapy with femara (06/2009) and received bisphosphonate therapy with Zometa 07/17/2009 and completed 2 years of therapy (09/07/2011).  Port removed on 06/15/10.     PRESENT THERAPY:  Currently on letrozole (06/2009); will completed 07/2019.      Medical Team:   Surgeon: Aram Beecham, MD  [facility]  [address] [place], [place] [address]  [phone]     Medical oncologist: Lowella Petties, MD  [facility]  [address] [place], [place] [address]  [phone]     Radiation oncologist: Joycelyn Man, MD  [facility]  [address]  [place], [place] [address]  [phone]    Family History   Problem Relation Name Age of Onset    Coronary Artery Disease Mother      Glaucoma Mother      Dementia Father      COPD Father      None Reported Sister Lynden Ang         Lives in [place]    Cancer-Prostate Brother Bernette Redbird Rathert 58    Cancer Paternal Grandmother  unknown primary-tumor between spine & esophagus    Cancer-Hematologic Paternal Grandmother          lymphoma    None Reported Daughter      None Reported Son      Cancer-Ovarian Paternal Aunt  66    Cancer-Pancreas Paternal Uncle  29    Cancer-Lung Paternal Uncle         GENETIC TESTING:   [employer] testing: Common Hereditary Cancers Panel 05/10/2017: VUS RAD50 c.1336A>G (p.Lys446Glu)  12/01/2022 Verification of status of VUS through [employer]. Remains variant of uncertain significance.    HISTORY OF PRESENT ILLNESS:   The patient returns to the clinic today for continued follow-up in the Breast Cancer Survivorship Clinic. She has a history of right breast CA, 10/30/2008 (57). Stage IIB; ER+ PR+ HER-2/neu negative; Grade 3. Post neoadjuvant pathologic staging ypT1aN1(mic)M0. Post mastectomy radiation therapy (Dr. Joycelyn Man) completed 07/16/2009. She then started antihormone therapy with femara (06/2009) and received bisphosphonate therapy with Zometa 07/17/2009 and completed 2 years of therapy (09/07/2011).  Port removed on 06/15/10. Continues on letrozole (06/2009); will complete in 10 years (06/2019). She is feeling well, denies changes on the right chest wall, denies left breast changes.         Review of Systems   Significant fatigue is a chronic problem.   Now established with Fredia Sorrow, MD (PCP); [place], [place].   Her husband Tiffany Burton) was diagnosed with A-FIB and had (cardiac ablation in 06/2015; Dr. Wallene Huh, cardiology [facility])  Cardioversion in 07/2015. Tiffany Madrid, MD @ [facility]). 07/2016; cardiac ablation x 2. Holter monitor x one week. 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   Femara completed 07/2019; feels more energetic.   COVID vaccination completed (Moderna; 05/09/2019 and 06/06/2019).   COVID booster 03/05/2020; Moderna.   [employer] testing: Common Hereditary Cancers Panel 05/10/2017: VUS RAD50 c.1336A>G (p.Lys446Glu)  Weight (BMI 36.79; # 194 <- 36.94; # 195 <- 36.58; # 196 <- 35.31; # 189 <- 36.47; # 190 <- 36.31; # 194 <- 31.55 <- 33.52 <- 34.0 <- 35.3 <- 34.4 <- 34.12 <- 34.29 <- 34.30).   No SOB, cough or swelling of the extremities.   No chest pain or palpitations.  No changes in the right chest wall. Denies left breast changes.   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   No weakness. No skin rash.   No nausea, vomiting, diarrhea, constipation or abdominal pain.  Colonoscopy 2014 (tubular adenoma). Repeat 03/2018; polyp high grade dysplasia. Repeat recommended 03/2019. She had a repeat colonoscopy 04/2019; small polyp. Repeat 05/2022; adenomas again. Repeat 3 years Marge Duncans).  Due 2027.  No FH colon CA (father did have polyps).    No dysuria or hematuria. Occasional hematochezia (hemmorhoid).   Frequent urination.  Post menopausal; uterus and ovaries intact. No bleeding.   Occasional hot flashes; tolerable. No vaginal dryness.   Has not restarted YOGA, enjoys reading so she does not exercise much.    No depression or anxiety.       Objective:          ACETAMINOPHEN (TYLENOL PO) Take  by mouth as Needed.    amLODIPine (NORVASC) 10 mg tablet Take one tablet by mouth every morning.    brimonidine(+) (ALPHAGAN) 0.2 % OP ophthalmic solution Apply one drop to both eyes twice daily.    CALCIUM CITRATE/VITAMIN D3 (CALCIUM CITRATE + D PO) Take 630 mg by mouth twice daily.    cephalexin (KEFLEX) 500 mg capsule Take  one capsule by mouth four times daily.    cholecalciferol (vitamin D3) 100 mcg (4,000 unit) cap Take one capsule by mouth daily.    dorzolamide (TRUSOPT) 2 % ophthalmic solution Apply one drop to left eye as directed twice daily.    IBUPROFEN PO Take  by mouth as Needed.    latanoprost (XALATAN) 0.005 % ophthalmic solution Place one drop into or around eye(s) at bedtime daily.    loratadine (CLARITIN) 10 mg PO tablet Take one tablet by mouth daily.    MULTIVITAMIN PO Take  by mouth.    PARoxetine HCL (PAXIL) 20 mg tablet Take one tablet by mouth at bedtime daily.    simvastatin (ZOCOR) 40 mg tablet Take one tablet by mouth at bedtime daily.    timolol (TIMOPTIC) 0.5 % OP ophthalmic solution Apply one drop to both eyes twice daily.    [employer] .MEDSUPPLY     Vitals:    12/01/22 1445   BP: 128/78   BP Source: Arm, Left Upper   Pulse: 86   Temp: 36.7 ?C (98.1 ?F)   Resp: 16   SpO2: 97%   TempSrc: Oral   PainSc: Zero   Weight: 88.3 kg (194 lb 9.6 oz)   Height: 154.9 cm (5' 0.98)       Body mass index is 36.79 kg/m?Marland Kitchen        Pain Addressed:  N/A     Fatigue Scale:0    Patient Evaluated for a Clinical Trial: No treatment clinical trial available for this patient.     [place] Cooperative Oncology Group performance status is 0, Fully active, able to carry on all pre-disease performance without restriction.Marland Kitchen     Physical Exam  Vitals reviewed.   Chest:       Lymphadenopathy:      Cervical: No cervical adenopathy.      Upper Body:      Right upper body: No supraclavicular adenopathy.      Left upper body: No supraclavicular adenopathy.     This is a 72 year old female in no acute distress; well-developed, well-nourished.   [employer] testing: Common Hereditary Cancers Panel 05/10/2017: VUS RAD50 c.1336A>G (p.Lys446Glu)  HEENT: No icterus  Neck: No JVD, supple.   Chest: CTA bilaterally.   CV: RRR without murmur.   Abdomen: Soft, non-distended, non-tender, positive bowel sounds, no organomegaly.    Skin: No rash.   Back: No tenderness with palpation or percussion over the spine.   Extremitites: No clubbing, cyanosis or edema.   CN: II-XII grossly intact; no sensory or motor abnormalities.        Lab Results   Component Value Date/Time    CA2729 26.1 06/01/2015 01:05 PM     Lab [facility] 12/02/2015:   CBC/chemistry: Glucose 121  Otherwise within normal limits    Received lab 03/13/2020:   Vitamin D drawn 02/27/2020: (66) from [facility] in [place], [place]. Continue taking Vitamin D3 (4,000IU) daily.        SURGICAL PATHOLOGY 04/14/2009:   Final Diagnosis:  A. 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    B. Lymph node (1), sentinel lymph node, biopsy:  Metastatic carcinoma, consistent with metastatic ductal carcinoma (1/1) evident on pan-cytokeratin staining.    C. Lymph node (1), sentinel lymph node, biopsy:   Metastatic carcinoma, consistent with metastatic ductal carcinoma (1/1).    D. Lymph nodes (3), sentinel lymph node, biopsy:  There is no evidence of malignancy (0/3).  The diagnosis is supported by negative immunohistochemical staining for pan-cytokeratin.    E. Fibroadipose tissue, additional level 1 right axillary tissue, biopsy:  Benign soft tissue.  There is no evidence of malignancy.     F. Fibroadipose tissue, additional level 2 right axillary, biopsy:  Benign soft tissue.  There is no evidence of malignancy.     G. Breast tissue and lymph nodes (7), right breast with level 1 and 2 axillary dissection, mastectomy:   Residual invasive ductal carcinoma, nuclear grade 2. See comment.  Metastatic carcinoma involving two lymph nodes (2/7).     Comment:  INVASIVE CARCINOMA OF THE BREAST STATUS POST NEOADJUVANT THERAPY  Specimen Type: Mastectomy   Laterality: Right  Tumor Site: Upper outer quadrant   Tumor Bed Identified: Yes  Size/Extent of Tumor Bed: 3.6 x 3.4 x 2.0 cm  Size/Extent of Residual Invasive Tumor: 0.2cm in aggregate dimension  Average Residual Viable Cancer Cellularity of the Tumor Bed: 2%   Histologic Type: Invasive ductal carcinoma  Histologic Grade (Nottingham Histologic Score): moderate differentiated,  II/III  Tubule Formation: 3  Nuclear Grade: 2  Mitotic Count (40x objective): 1  Total Nottingham Score: 6/9  Surgical Margins: 1.0cm from closest margin (deep margin)   Ductal Carcinoma In-situ (DCIS): Absent  Lobular Carcinoma In-situ (LCIS): Absent  Lymph-Vascular Invasion: Identified   Perineural Invasion: Not identified  Tumor Necrosis: Not identified  Nipple Involvement: Not identified  Skin Involvement: Not identified  Lymph Node Sampling: sentinel lymph node with axillary dissection  Total number of involved nodes/total nodes found: 6/15   Size of largest metastasis: 0.1cm  Extranodal extension: Not identified  Non-neoplastic Breast Tissue: Fibrosis  Overall Response to Neoadjuvant Therapy:  In the Breast: Partial  In the Lymph Nodes: Partial  Prognostic markers: See Chartmaxx for addendum Image Analysis report  Time between tumor removal and placement into formalin < 1 hour: Yes  Fixation Time between 6-48 hours: Yes  Pathologic Staging:   ypT1a, N28mi    CBC w diff    Lab Results Component Value Date/Time    WBC 7.5 12/01/2022 01:45 PM    RBC 4.40 12/01/2022 01:45 PM    HGB 13.4 12/01/2022 01:45 PM    HCT 39.8 12/01/2022 01:45 PM    MCV 90.5 12/01/2022 01:45 PM    MCH 30.5 12/01/2022 01:45 PM    MCHC 33.7 12/01/2022 01:45 PM    RDW 14.4 12/01/2022 01:45 PM    PLTCT 280 12/01/2022 01:45 PM    MPV 7.4 12/01/2022 01:45 PM    Lab Results   Component Value Date/Time    NEUT 69 12/01/2022 01:45 PM    ANC 5.20 12/01/2022 01:45 PM    LYMA 18 (L) 12/01/2022 01:45 PM    ALC 1.40 12/01/2022 01:45 PM    MONA 11 12/01/2022 01:45 PM    AMC 0.80 12/01/2022 01:45 PM    EOSA 1 12/01/2022 01:45 PM    AEC 0.10 12/01/2022 01:45 PM    BASA 1 12/01/2022 01:45 PM    ABC 0.00 12/01/2022 01:45 PM        Comprehensive Metabolic Profile    Lab Results   Component Value Date/Time    NA 138 12/01/2022 01:45 PM    K 3.6 12/01/2022 01:45 PM    CL 102 12/01/2022 01:45 PM    CO2 28 12/01/2022 01:45 PM    GAP 8 12/01/2022 01:45 PM    BUN 18 12/01/2022 01:45 PM  CR 0.85 12/01/2022 01:45 PM    GLU 97 12/01/2022 01:45 PM    Lab Results   Component Value Date/Time    CA 9.9 12/01/2022 01:45 PM    ALBUMIN 4.0 12/01/2022 01:45 PM    TOTPROT 7.0 12/01/2022 01:45 PM    ALKPHOS 68 12/01/2022 01:45 PM    AST 14 12/01/2022 01:45 PM    ALT 16 12/01/2022 01:45 PM    TOTBILI 0.4 12/01/2022 01:45 PM    GFR >60 12/26/2019 11:01 AM    GFRAA >60 12/26/2019 11:01 AM          SCANS:   CT CHEST/ABDOMEN 12/02/2009:   CHEST:   1. STATUS POST RIGHT MASTECTOMY AND AXILLARY LYMPH NODE DISSECTION WITHOUT EVIDENCE OF PULMONARY METASTATIC DISEASE OR THORACIC LYMPHADENOPATHY.  ABDOMEN AND PELVIS:  1. NO EVIDENCE OF HEPATIC METASTATIC DISEASE OR ABDOMINOPELVIC LYMPHADENOPATHY.   BONE SCAN 12/02/2009:  NO SCINTIGRAPHIC EVIDENCE OF OSSEOUS METASTATIC DISEASE   BONE SCAN 12/01/2010:  NO SCINTIGRAPHIC EVIDENCE OF OSSEOUS METASTATIC DISEASE.   12/14/2011 BONE SCAN:   DEGENERATIVE CHANGES OF THE AXIAL AND APPENDICULAR SKELETON AS DESCRIBED WITHOUT EVIDENCE OF OSSEOUS METASTASIS.   12/01/2010 CHEST X-RAY:  STATUS POST RIGHT MASTECTOMY AND AXILLARY LYMPH NODE DISSECTION WITHOUT   EVIDENCE OF ACUTE DISEASE IN THE CHEST.   12/14/2011 CHEST X-RAY:   NO ACUTE DISEASE IN THE CHEST.      BREAST IMAGING:   12/09/2013 LEFT MAMMOGRAM: ASSESSMENT: BIRAD 1-NEGATIVE  12/22/2014 LEFT MAMMOGRAM:  ASSESSMENT: BIRAD 1 - NEGATIVE  12/07/2015 LEFT MAMMOGRAM: ACR BI-RADS? Assessments: BIRAD 1-Negative  12/08/2016 LEFT MAMMOGRAM:  ACR BI-RADS? Assessments: BIRAD 1-Negative  12/14/2017 LEFT MAMMOGRAM: ACR BI-RADS? Assessments: BIRAD 1-Negative  12/20/2018 LEFT MAMMOGRAM:  ASSESSMENT: BIRAD 1-Negative   12/26/2019 LEFT MAMMOGRAM:  ASSESSMENT: BIRAD 1-Negative   12/10/2020 LEFT MAMMOGRAM:  ASSESSMENT: BIRAD 1-Negative   12/13/2021 LEFT MAMMOGRAM:  ASSESSMENT:   Left: 1 - Negative   Overall: 1 - Negative   12/01/2022 LEFT MAMMOGRAM:  ASSESSMENT:   Left: 1 - Negative   Overall: 1 - Negative       BONE HEALTH:   06/01/2015 BMD:  Improvement since 2016 evaluation; mild osteopenia.   12/14/2017 BMD: Stable bone mineral density with persistent low bone mass (osteopenia).   12/26/2019 BMD:  Persistent low bone mass (osteopenia) without significant overall change since 2019.   12/13/2021 BMD: Persistent low bone mass (osteopenia).   LEFT FEMORAL NECK     Current: 0.872 g/cm2, T-score of -1.2       Previous: 0.856 g/cm2, T-score of -1.3     LEFT TOTAL HIP     Current: 0.967 g/cm2, T-score of -0.3     Previous: 0.955 g/cm2, T-score of -0.4     RIGHT FEMORAL NECK   Current: 0.834 g/cm2, T-score of -1.5       Previous: 0.835 g/cm2, T-score of -1.5     RIGHT TOTAL HIP   Current: 0.901 g/cm2, T-score of -0.8   Previous: 0.900 g/cm2, T-score of -0.9       FRAX SCORE   10 year risk hip fracture = 1.5%   10 year risk major osteoporotic fracture = 7.4%       Vitamin D (66) from [facility] in [place], [place]. Continue taking Vitamin D3 (4,000IU) daily.     ASSESSMENT AND PLAN:   1. Right breast CA, 10/2008. Clinical stage T2N1M0; IIB (ER/PR positive, HER-2 negative Grade 3, low PR probable luminal B). Completed 4 cycles of EC followed by 4 cycles  of taxotere eoadjuvantly and underwent modified radical mastectomy 04/14/2009 with pathologic staging ypT1aN1(mic)M0.She has completed right chest wall radiation therapy under Dr. Darcus Austin on 07/16/2009. Now on Femara since 07/17/2009. Patient is tolerating endocrine therapy well. Completed AI therapy (letrozole) in 07/2019.   2. History of right upper extremetry DVT:  She was initially on warfarin and managed by PCP in [place], [place]. She has completed anti-coagulation therapy. No further problems.   3. Genetic testing: [employer] testing: Common Hereditary Cancers Panel 05/10/2017: VUS RAD50 c.1336A>G (p.Lys446Glu) 12/01/2022 Verification of status of VUS through [employer]. Remains variant of uncertain significance.  4. Patient completed 2 years of  Zometa 08/2011. BMD 05/2013; stable mild osteopenia. Repeat 06/23/2014 showed moderate osteopenia. Repeat 05/2015; improvement to mild osteopenia. Repeat 12/14/2017; stable osteopenia. Repeat BMD 12/26/2019; stable osteopenia. Repeat 12/13/2021:  Stable osteopenia with low FRAX. Repeat in 2 years Due 2025  5. Reviewed signs to watch for that could indicate a local or distant recurrence. Patient will call our office with any new signs.   Local recurrence  Signs and symptoms of local recurrence following mastectomy/reconstruction may include:   One or more painless nodules on or under the skin of your chest wall   A new area of thickening along or near the mastectomy scar  Regional recurrence   A regional breast cancer recurrence means the cancer has come back in the lymph nodes in your armpit or collarbone area. Signs and symptoms of regional recurrence may include:   A lump or swelling in the lymph nodes under your arm or in the groove above your collarbone   Swelling of your arm (this could be related to lymphedema or even a blood clot)  Persistent pain in your arm and shoulder   Increasing loss of sensation in your arm and hand  Distant (metastatic) recurrence   A distant, or metastatic, recurrence means the cancer has traveled to distant parts of the body, most commonly the bones, liver and lungs. The signs and symptoms may include:   Pain, such as chest or bone pain   Persistent, dry cough   Difficulty breathing   Loss of appetite   Persistent nausea, vomiting or weight loss   Swelling in the abdomen  Severe headaches  When to call our office   You know your body best -- what feels normal and what doesn't. It's important to be aware of the signs and symptoms of recurrent breast cancer, such as:   New and persistent pain   Changes or new lumps in your breast or surgical scar or chest wall   Weight loss   Shortness of breath  If you experience any signs and symptoms that might suggest a recurrence, call our office. Patient verbalized understanding and phone numbers provided.    6. History of glaucoma. Dr. Mignon Pine [place], [place]).  7. RTC 12 months with lab and left screening mammogram.      Total Time Today was 40 minutes in the following activities: Preparing to see the patient, Obtaining and/or reviewing separately obtained history, Performing a medically appropriate examination and/or evaluation, Counseling and educating the patient/family/caregiver, Ordering medications, tests, or procedures, Referring and communication with other health care professionals (when not separately reported), Documenting clinical information in the electronic or other health record, Independently interpreting results (not separately reported) and communicating results to the patient/family/caregiver, and Care coordination (not separately reported)      Ernie Hew, APRN-BC, CBCN  Nurse Practitioner  Collaborating physician: Dellia Cloud, MD  NPI #  [id number]    Sharia Reeve, APRN-NP

## 2022-12-01 ENCOUNTER — Encounter: Admit: 2022-12-01 | Discharge: 2022-12-01 | Payer: MEDICARE

## 2022-12-01 DIAGNOSIS — H409 Unspecified glaucoma: Secondary | ICD-10-CM

## 2022-12-01 DIAGNOSIS — I10 Essential (primary) hypertension: Secondary | ICD-10-CM

## 2022-12-01 DIAGNOSIS — Z13820 Encounter for screening for osteoporosis: Secondary | ICD-10-CM

## 2022-12-01 DIAGNOSIS — Z08 Encounter for follow-up examination after completed treatment for malignant neoplasm: Secondary | ICD-10-CM

## 2022-12-01 DIAGNOSIS — D126 Benign neoplasm of colon, unspecified: Secondary | ICD-10-CM

## 2022-12-01 DIAGNOSIS — Z9221 Personal history of antineoplastic chemotherapy: Secondary | ICD-10-CM

## 2022-12-01 DIAGNOSIS — R5383 Other fatigue: Secondary | ICD-10-CM

## 2022-12-01 DIAGNOSIS — E785 Hyperlipidemia, unspecified: Secondary | ICD-10-CM

## 2022-12-01 DIAGNOSIS — R7611 Nonspecific reaction to tuberculin skin test without active tuberculosis: Secondary | ICD-10-CM

## 2022-12-01 DIAGNOSIS — M85851 Other specified disorders of bone density and structure, right thigh: Secondary | ICD-10-CM

## 2022-12-01 DIAGNOSIS — Z853 Personal history of malignant neoplasm of breast: Secondary | ICD-10-CM

## 2022-12-01 DIAGNOSIS — Z9189 Other specified personal risk factors, not elsewhere classified: Secondary | ICD-10-CM

## 2022-12-01 DIAGNOSIS — Z1231 Encounter for screening mammogram for malignant neoplasm of breast: Secondary | ICD-10-CM

## 2022-12-01 DIAGNOSIS — Z1379 Encounter for other screening for genetic and chromosomal anomalies: Secondary | ICD-10-CM

## 2022-12-01 DIAGNOSIS — M1990 Unspecified osteoarthritis, unspecified site: Secondary | ICD-10-CM

## 2022-12-01 DIAGNOSIS — F419 Anxiety disorder, unspecified: Secondary | ICD-10-CM

## 2022-12-01 DIAGNOSIS — Z79899 Other long term (current) drug therapy: Secondary | ICD-10-CM

## 2022-12-01 DIAGNOSIS — L089 Local infection of the skin and subcutaneous tissue, unspecified: Secondary | ICD-10-CM

## 2022-12-01 DIAGNOSIS — Z780 Asymptomatic menopausal state: Secondary | ICD-10-CM

## 2022-12-01 LAB — CBC AND DIFF
ABSOLUTE BASO COUNT: 0 10*3/uL (ref 0–0.20)
ABSOLUTE EOS COUNT: 0.1 10*3/uL (ref 0–0.45)
ABSOLUTE LYMPH COUNT: 1.4 10*3/uL (ref 1.0–4.8)
ABSOLUTE MONO COUNT: 0.8 10*3/uL (ref 0–0.80)
ABSOLUTE NEUTROPHIL: 5.2 10*3/uL (ref 1.8–7.0)
BASOPHILS %: 1 % (ref >60)
EOSINOPHILS %: 1 % (ref 0–5)
HEMATOCRIT: 39 % (ref 36–45)
HEMOGLOBIN: 13 g/dL (ref 12.0–15.0)
LYMPHOCYTES %: 18 % — ABNORMAL LOW (ref 24–44)
MCH: 30 pg (ref 26–34)
MCHC: 33 g/dL (ref 32.0–36.0)
MCV: 90 FL (ref 80–100)
MONOCYTES %: 11 % (ref 4–12)
MPV: 7.4 FL (ref 7–11)
NEUTROPHILS %: 69 % (ref 41–77)
PLATELET COUNT: 280 10*3/uL (ref 150–400)
RBC COUNT: 4.4 M/UL (ref 4.0–5.0)
RDW: 14 % (ref 11–15)
WBC COUNT: 7.5 10*3/uL (ref 4.5–11.0)

## 2022-12-01 LAB — 25-OH VITAMIN D (D2 + D3): VITAMIN D (25-OH) TOTAL: 52 ng/mL (ref 30–80)

## 2022-12-01 LAB — COMPREHENSIVE METABOLIC PANEL: SODIUM: 138 MMOL/L (ref 137–147)

## 2022-12-01 MED ORDER — CEPHALEXIN 500 MG PO CAP
500 mg | ORAL_CAPSULE | Freq: Four times a day (QID) | ORAL | 0 refills | Status: AC
Start: 2022-12-01 — End: ?

## 2022-12-01 NOTE — Patient Instructions
Tiffany Burton,     Thank you for coming to see us today.   Please call our office or send a message through MyChart if you have any questions or concerns.                                                                                                                                                           Barron Vanloan RN, BSN, OCN, CBCN, CN-BN  Clinical Nurse Coordinator for Lori Ranallo, APRN-BC        [facility] Cancer [facility] Survivorship [facility]   [phone] (nurse phone)  [phone] (URGENT NEED or After-Hours Line - Answered 24/7)  [phone] (fax)      [facility]   Clinic: Monday, Thursday, Friday   Admin: Tuesday  [address]  [place], [place] [address]  Scheduling: [phone]  Fax: [phone]   [facility]   Clinic: Wednesday  [address]  [place], [place] [address]  Scheduling: [phone]                         [phone]                         [phone]   Fax: [phone] or [phone]

## 2023-12-04 NOTE — Progress Notes
 Name: Tiffany Burton          MRN: [id number]      DOB: [birth year]      AGE: 73 y.o.   DATE OF SERVICE: 12/07/2023    Follow Up and Breast Cancer (SV[facility])    Tiffany Burton is a 73 y.o. female     DIAGNOSIS: Right breast CA, 10/30/2008 (age 65)    STAGE: cT2N1M0, Stage IIB; ER+ PR+ HER-2/neu negative; Grade 3. Post neoadjuvant pathologic staging ypT1aN1(mic)M0    TREATMENT SUMMARY:  Ms. Tiffany Burton underwent rountine screening mammogram on 10/21/2008 at [facility] which demonstrated a spiculated density in the UOQ of the right breast (BIRADS Category 0). Additional imaging was recommended and the patient underwent a diagnostic mammogram of the right breast with ultrasound which were read as BIRADS Category 4. These imaging studies revealed a 10 o'clock lesion in the right breast measuring 1.6 x 1.3 x 1.3cm. CNBX was performed on 10/27/2008 and demonstrated invasive ductal carcinoma, Grade 3, ER 100%, PR 22%, Ki-67 3%, HER-2/neu 0+ by IHC, p53 0%, EGFR 0%. She then sought an opinion with Dr. Niels Freund at [facility] and repeat breast imaging was performed. Ultrasound on 11/10/2008 revealed an increase in the right breast mass 10 o'clock position 7cm FTN measuring 2.5 x 2.7 x 1.8cm. In addition there was a suspicious appearing right axillary lymph node identified measuring 0.9 x 1.8 x 0.7cm. Biopsy of the lymph node 11/10/2008 demonstrated metastatic carcinoma, consistent with ductal carcinoma of the breast with predictive markers of ER 99%, PR 15%, Ki-67 7%, HER-2/neu 0+ by IHC and FISH 1.0. MRI was also performed on 11/10/2008 and the right breast carcinoma measured 2.5 x 3.5 x 3.9 cm. The patient had a portacath placed by Dr. Freund 11/20/2008.      Neoadjuvant chemotherapy was recommended; the patient completed Epirubicin/Cyclophosphamide x 4 cycles (11/20/2008 - 12/31/2008) and Taxotere 75mg /m2 every 3 weeks (01/14/2009- 03/18/2009).   She underwent right modified radical mastectomy 04/14/2009 [place]). Pathology showed residual  0.2cm, grade 2, IDC with ALND (6/15) positive lymph nodes.  Prognostic markers on mastectomy: ER 100%, PR 0%, HER-2 0+, Ki-67 35%. Prognostic markers on LN: ER 100%, PR 0%, HER-2 0+, Ki-67 1%. Right chest wall radiation (Dr. Consuelo Sprague) completed 07/16/2009. She then started antihormone therapy with femara  (06/2009) and received bisphosphonate therapy with Zometa 07/17/2009 and completed 2 years of therapy (09/07/2011).  Port removed on 06/15/10.  Letrozole  therapy (06/2009 - 07/2019).      Medical Team:   Surgeon: Niels Freund, MD  [facility]  [facility]  [address] [place], [place] [address]  [phone]     Medical oncologist: Niels Downing, MD  [facility]  [facility]  [address] [place], [place] [address]  [phone]     Radiation oncologist: Consuelo Sprague, MD  [facility]  [address]  [place], [place] [address]  [phone]    Family History   Problem Relation Name Age of Onset    Coronary Artery Disease Mother      Glaucoma Mother      Dementia Father      COPD Father      None Reported Sister Andres         Lives in [place]    Cancer-Prostate Brother Abby Rathert 58    Cancer Paternal Grandmother          unknown primary-tumor between spine & esophagus  Cancer-Hematologic Paternal Grandmother          lymphoma    None Reported Daughter      None Reported Son      Cancer-Ovarian Paternal Aunt  52    Cancer-Pancreas Paternal Uncle  102    Cancer-Lung Paternal Uncle         GENETIC TESTING:   05/10/2017 [employer] testing: Common Hereditary Cancers Panel: VUS RAD50 c.1336A>G (p.Lys446Glu)  12/07/2023 Verification of status of VUS through [employer]. Remains variant of uncertain significance.    HISTORY OF PRESENT ILLNESS:   The patient returns to the clinic today for continued follow-up in the Breast Cancer Survivorship Clinic. She has a history of right breast CA, 10/30/2008 (57). Stage IIB; ER+ PR+ HER-2/neu negative; Grade 3. Post neoadjuvant pathologic staging ypT1aN1(mic)M0. Post mastectomy radiation therapy (Dr. Consuelo Sprague) completed 07/16/2009. She then started antihormone therapy with femara  (06/2009) and received bisphosphonate therapy with Zometa 07/17/2009 and completed 2 years of therapy (09/07/2011).  Port removed on 06/15/10. Continues on letrozole  (06/2009); will complete in 10 years (06/2019).          Review of Systems   Continues to have significant fatigue.   Clemens a week ago (tripped over lawnmower wheel in the garage, bruised her right upper arm).   Married; they live [place], [place].   Now established with Consuelo Hocking, MD (PCP); [place], [place].   Her husband Enrigue) was diagnosed with A-FIB and had (cardiac ablation in 06/2015; Dr. Liborio, cardiology [facility]). Cardioversion in 07/2015. (Marina  Velora, MD @ [facility]). 07/2016; cardiac ablation x 2. Holter monitor x one week. 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   Femara  completed 5/2021COVID vaccination completed (Moderna; 05/09/2019 and 06/06/2019).   COVID booster 03/05/2020; Moderna.   [employer] testing: Common Hereditary Cancers Panel 05/10/2017: VUS RAD50 c.1336A>G (p.Lys446Glu)  Weight (BMI 35.14; # 193 <- 36.79; # 194 <- 36.94; # 195 <- 36.58; # 196 <- 35.31; # 189 <- 36.47; # 190 <- 36.31; # 194 <- 31.55 <- 33.52 <- 34.0 <- 35.3 <- 34.4 <- 34.12 <- 34.29 <- 34.30).   SOB with exertion, has stairs in the home.   Denies cough. No swelling of the UE.   Denies pressure with lying flat.   Has noticed swelling in the ankles/lower legs x 2 months.    No chest pain or palpitations.  No changes in the right chest wall. Denies left breast changes.   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   No weakness. No skin rash.   No nausea, vomiting, constipation or abdominal pain. Occasional diarrhea  Increased GERD  Colonoscopy 2014 (tubular adenoma). Repeat 03/2018; polyp high grade dysplasia. Repeat recommended 03/2019. She had a repeat colonoscopy 04/2019; small polyp. Repeat 05/2022; adenomas again. Repeat 3 years [place]).  Due 2027.  No FH colon CA (father did have polyps).    No dysuria or hematuria. Occasional hematochezia (hemmorhoid).   Frequent urination; PCP watches HgbA1c.   Post menopausal; uterus and ovaries intact. No bleeding.   Drinks (2) beers every evening.   Occasional hot flashes; tolerable. No vaginal dryness.   No depression or anxiety.       Objective:          ACETAMINOPHEN (TYLENOL PO) Take  by mouth as Needed.    amLODIPine (NORVASC) 10 mg tablet Take one tablet by mouth every morning.    brimonidine(+) (ALPHAGAN) 0.2 % OP ophthalmic solution Apply one drop to both eyes  twice daily.    CALCIUM CITRATE/VITAMIN D3 (CALCIUM CITRATE + D PO) Take 630 mg by mouth twice daily.    cephalexin  (KEFLEX ) 500 mg capsule Take one capsule by mouth four times daily.    cholecalciferol (vitamin D3) 100 mcg (4,000 unit) cap Take one capsule by mouth daily.    dorzolamide (TRUSOPT) 2 % ophthalmic solution Apply one drop to left eye as directed twice daily. (Patient not taking: Reported on 12/07/2023)    IBUPROFEN PO Take  by mouth as Needed.    latanoprost (XALATAN) 0.005 % ophthalmic solution Place one drop into or around eye(s) at bedtime daily.    loratadine (CLARITIN) 10 mg PO tablet Take one tablet by mouth daily.    MULTIVITAMIN PO Take  by mouth.    PARoxetine HCL (PAXIL) 20 mg tablet Take one tablet by mouth at bedtime daily.    simvastatin (ZOCOR) 40 mg tablet Take one tablet by mouth at bedtime daily.    timolol (TIMOPTIC) 0.5 % OP ophthalmic solution Apply one drop to both eyes twice daily.    [employer] medical supply .MEDSUPPLY       Vitals:    12/07/23 1439   BP: 114/66   BP Source: Arm, Right Upper   Pulse: 92   Temp: 36.7 ?C (98.1 ?F)   Resp: 16   SpO2: 96%   TempSrc: Oral   PainSc: Zero   Weight: 87.7 kg (193 lb 6.4 oz)   Height: 158 cm (5' 2.21)       Body mass index is 35.14 kg/m?SABRA        Pain Addressed:  N/A     Fatigue Scale:0    Patient Evaluated for a Clinical Trial: No treatment clinical trial available for this patient.     [place] Cooperative Oncology Group performance status is 0, Fully active, able to carry on all pre-disease performance without restriction.SABRA     Physical Exam  Vitals reviewed.   Chest:       Lymphadenopathy:      Cervical: No cervical adenopathy.      Upper Body:      Right upper body: No supraclavicular adenopathy.      Left upper body: No supraclavicular adenopathy.     This is a 73 year old female in no acute distress; well-developed, well-nourished.   [employer] testing: Common Hereditary Cancers Panel 05/10/2017: VUS RAD50 c.1336A>G (p.Lys446Glu)  HEENT: No icterus  Neck: No JVD, supple.   Chest: CTA bilaterally.   CV: RRR without murmur.   Abdomen: Soft, non-distended, non-tender, positive bowel sounds, no organomegaly.   Skin: No rash. Ecchymosis (G4) Right forearm.   Back: No tenderness with palpation or percussion over the spine.   Extremitites: No clubbing, cyanosis. Edema bilateral +1 pitting.   CN: II-XII grossly intact; no sensory or motor abnormalities.        Lab Results   Component Value Date/Time    CA2729 26.1 06/01/2015 01:05 PM     Lab [facility] 12/02/2015:   CBC/chemistry: Glucose 121  Otherwise within normal limits    Received lab 03/13/2020:   Vitamin D drawn 02/27/2020: (66) from [facility] in [place], [place]. Continue taking Vitamin D3 (4,000IU) daily.        SURGICAL PATHOLOGY 04/14/2009:   Final Diagnosis:  A. 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    B. Lymph node (1), sentinel lymph node, biopsy:  Metastatic carcinoma,  consistent with metastatic ductal carcinoma (1/1) evident on pan-cytokeratin staining.    C. Lymph node (1), sentinel lymph node, biopsy:   Metastatic carcinoma, consistent with metastatic ductal carcinoma (1/1).    D. Lymph nodes (3), sentinel lymph node, biopsy:   There is no evidence of malignancy (0/3).  The diagnosis is supported by negative immunohistochemical staining for pan-cytokeratin.    E. Fibroadipose tissue, additional level 1 right axillary tissue, biopsy:  Benign soft tissue.  There is no evidence of malignancy.     F. Fibroadipose tissue, additional level 2 right axillary, biopsy:  Benign soft tissue.  There is no evidence of malignancy.     G. Breast tissue and lymph nodes (7), right breast with level 1 and 2 axillary dissection, mastectomy:   Residual invasive ductal carcinoma, nuclear grade 2. See comment.  Metastatic carcinoma involving two lymph nodes (2/7).     Comment:  INVASIVE CARCINOMA OF THE BREAST STATUS POST NEOADJUVANT THERAPY  Specimen Type: Mastectomy   Laterality: Right  Tumor Site: Upper outer quadrant   Tumor Bed Identified: Yes  Size/Extent of Tumor Bed: 3.6 x 3.4 x 2.0 cm  Size/Extent of Residual Invasive Tumor: 0.2cm in aggregate dimension  Average Residual Viable Cancer Cellularity of the Tumor Bed: 2%   Histologic Type: Invasive ductal carcinoma  Histologic Grade (Nottingham Histologic Score): moderate differentiated,  II/III  Tubule Formation: 3  Nuclear Grade: 2  Mitotic Count (40x objective): 1  Total Nottingham Score: 6/9  Surgical Margins: 1.0cm from closest margin (deep margin)   Ductal Carcinoma In-situ (DCIS): Absent  Lobular Carcinoma In-situ (LCIS): Absent  Lymph-Vascular Invasion: Identified   Perineural Invasion: Not identified  Tumor Necrosis: Not identified  Nipple Involvement: Not identified  Skin Involvement: Not identified  Lymph Node Sampling: sentinel lymph node with axillary dissection  Total number of involved nodes/total nodes found: 6/15   Size of largest metastasis: 0.1cm  Extranodal extension: Not identified  Non-neoplastic Breast Tissue: Fibrosis  Overall Response to Neoadjuvant Therapy:  In the Breast: Partial  In the Lymph Nodes: Partial  Prognostic markers: See Chartmaxx for addendum Image Analysis report  Time between tumor removal and placement into formalin < 1 hour: Yes  Fixation Time between 6-48 hours: Yes  Pathologic Staging:   ypT1a, N18mi    CBC w diff    Lab Results   Component Value Date/Time    WBC 8.80 12/07/2023 01:29 PM    RBC 4.44 12/07/2023 01:29 PM    HGB 13.3 12/07/2023 01:29 PM    HCT 39.5 12/07/2023 01:29 PM    MCV 89.0 12/07/2023 01:29 PM    MCH 30.1 12/07/2023 01:29 PM    MCHC 33.8 12/07/2023 01:29 PM    RDW 13.9 12/07/2023 01:29 PM    PLTCT 269 12/07/2023 01:29 PM    MPV 8.1 12/07/2023 01:29 PM    Lab Results   Component Value Date/Time    NEUT 76.1 12/07/2023 01:29 PM    ANC 6.70 12/07/2023 01:29 PM    LYMA 12.7 (L) 12/07/2023 01:29 PM    ALC 1.10 12/07/2023 01:29 PM    MONA 9.4 12/07/2023 01:29 PM    AMC 0.80 12/07/2023 01:29 PM    EOSA 0.8 12/07/2023 01:29 PM    AEC 0.10 12/07/2023 01:29 PM    BASA 1.0 12/07/2023 01:29 PM    ABC 0.10 12/07/2023 01:29 PM        Comprehensive Metabolic Profile    Lab Results   Component Value Date/Time  NA 138 12/07/2023 01:29 PM    K 3.7 12/07/2023 01:29 PM    CL 102 12/07/2023 01:29 PM    CO2 29 12/07/2023 01:29 PM    GAP 7 12/07/2023 01:29 PM    BUN 17 12/07/2023 01:29 PM    CR 0.93 12/07/2023 01:29 PM    GLU 101 (H) 12/07/2023 01:29 PM    Lab Results   Component Value Date/Time    CA 9.5 12/07/2023 01:29 PM    ALBUMIN 4.0 12/07/2023 01:29 PM    TOTPROT 6.9 12/07/2023 01:29 PM    ALKPHOS 95 12/07/2023 01:29 PM    AST 25 12/07/2023 01:29 PM    ALT 25 12/07/2023 01:29 PM    TOTBILI 0.5 12/07/2023 01:29 PM    GFR >60 12/07/2023 01:29 PM    GFRAA >60 12/26/2019 11:01 AM          SCANS:   CT CHEST/ABDOMEN 12/02/2009:   CHEST:   1. STATUS POST RIGHT MASTECTOMY AND AXILLARY LYMPH NODE DISSECTION WITHOUT EVIDENCE OF PULMONARY METASTATIC DISEASE OR THORACIC LYMPHADENOPATHY.  ABDOMEN AND PELVIS:  1. NO EVIDENCE OF HEPATIC METASTATIC DISEASE OR ABDOMINOPELVIC LYMPHADENOPATHY.   BONE SCAN 12/02/2009:  NO SCINTIGRAPHIC EVIDENCE OF OSSEOUS METASTATIC DISEASE   BONE SCAN 12/01/2010:  NO SCINTIGRAPHIC EVIDENCE OF OSSEOUS METASTATIC DISEASE.   12/14/2011 BONE SCAN:   DEGENERATIVE CHANGES OF THE AXIAL AND APPENDICULAR SKELETON AS DESCRIBED WITHOUT EVIDENCE OF OSSEOUS METASTASIS.   12/01/2010 CHEST X-RAY:  STATUS POST RIGHT MASTECTOMY AND AXILLARY LYMPH NODE DISSECTION WITHOUT   EVIDENCE OF ACUTE DISEASE IN THE CHEST.   12/14/2011 CHEST X-RAY:   NO ACUTE DISEASE IN THE CHEST.      BREAST IMAGING:   12/09/2013 LEFT MAMMOGRAM: ASSESSMENT: BIRAD 1-NEGATIVE  12/22/2014 LEFT MAMMOGRAM:  ASSESSMENT: BIRAD 1 - NEGATIVE  12/07/2015 LEFT MAMMOGRAM: ACR BI-RADS? Assessments: BIRAD 1-Negative  12/08/2016 LEFT MAMMOGRAM:  ACR BI-RADS? Assessments: BIRAD 1-Negative  12/14/2017 LEFT MAMMOGRAM: ACR BI-RADS? Assessments: BIRAD 1-Negative  12/20/2018 LEFT MAMMOGRAM:  ASSESSMENT: BIRAD 1-Negative   12/26/2019 LEFT MAMMOGRAM:  ASSESSMENT: BIRAD 1-Negative   12/10/2020 LEFT MAMMOGRAM:  ASSESSMENT: BIRAD 1-Negative   12/13/2021 LEFT MAMMOGRAM:  ASSESSMENT:   Left: 1 - Negative   Overall: 1 - Negative   12/01/2022 LEFT MAMMOGRAM:  ASSESSMENT:   Left: 1 - Negative   Overall: 1 - Negative   12/07/2023 LEFT SCREENING MAMMOGRAM (TOMO):         BONE HEALTH:   06/01/2015 BMD:  Improvement since 2016 evaluation; mild osteopenia.   12/14/2017 BMD: Stable bone mineral density with persistent low bone mass (osteopenia).   12/26/2019 BMD:  Persistent low bone mass (osteopenia) without significant overall change since 2019.   12/13/2021 BMD: Persistent low bone mass (osteopenia).   LEFT FEMORAL NECK     Current: 0.872 g/cm2, T-score of -1.2       Previous: 0.856 g/cm2, T-score of -1.3     LEFT TOTAL HIP     Current: 0.967 g/cm2, T-score of -0.3     Previous: 0.955 g/cm2, T-score of -0.4     RIGHT FEMORAL NECK   Current: 0.834 g/cm2, T-score of -1.5       Previous: 0.835 g/cm2, T-score of -1.5     RIGHT TOTAL HIP   Current: 0.901 g/cm2, T-score of -0.8   Previous: 0.900 g/cm2, T-score of -0.9       FRAX SCORE   10 year risk hip fracture = 1.5%   10 year risk major osteoporotic fracture = 7.4%  Vitamin D (66) from [facility] in [place], [place]. Continue taking Vitamin D3 (4,000IU) daily.     12/07/2023 BMD:  LUMBAR SPINE, L1-L4   Current: 1.307 g/cm2, T-score of +0.9   Previous: 1.305 g/cm2, T-score of +0.9     There are however hypertrophic degenerative changes of the lumbar spine   which could artificially elevate the bone mineral density.     LEFT FEMORAL NECK    Current: 0.861 g/cm2, T-score of -1.3   Previous: 0.872 g/cm2, T-score of -1.2     LEFT TOTAL HIP    Current: 0.968 g/cm2, T-score of -0.3   Previous: 0.967 g/cm2, T-score of -0.3     RIGHT FEMORAL NECK   Current: 0.774 g/cm2, T-score of -1.9   Previous: 0.834 g/cm2, T-score of -1.5     RIGHT TOTAL HIP   Current: 0.899 g/cm2, T-score of -0.9   Previous: 0.901 g/cm2, T-score of -0.8      FRAX SCORE   10 year risk hip fracture = 2.5%   10 year risk major osteoporotic fracture = 11.3%     IMPRESSION:  Interval decrease in bone mineral density of right femoral neck with   stable bone mineral density lumbar spine and left hip. Persistent low bone mass (osteopenia).         ASSESSMENT AND PLAN:   1. Right breast CA, 10/2008. Clinical stage T2N1M0; IIB (ER/PR positive, HER-2 negative Grade 3, low PR probable luminal B). Completed 4 cycles of EC followed by 4 cycles of taxotere eoadjuvantly and underwent modified radical mastectomy 04/14/2009 with pathologic staging ypT1aN1(mic)M0.She has completed right chest wall radiation therapy under Dr. Jackolyn on 07/16/2009. Now on Femara  since 07/17/2009. Patient is tolerating endocrine therapy well. Completed AI therapy (letrozole ) in 07/2019.   2. History of right upper extremetry DVT:  She was initially on warfarin and managed by PCP in [place], [place]. She has completed anti-coagulation therapy. No further problems.   3. Genetic testing: [employer] testing: Common Hereditary Cancers Panel 05/10/2017: VUS RAD50 c.1336A>G (p.Lys446Glu) 12/07/2023 Verification of status of VUS through [employer]. Remains variant of uncertain significance.  4. Patient completed 2 years of  Zometa 08/2011. BMD 05/2013; stable mild osteopenia. Repeat 06/23/2014 showed moderate osteopenia. Repeat 05/2015; improvement to mild osteopenia. Repeat 12/14/2017; stable osteopenia. Repeat BMD 12/26/2019; stable osteopenia. Repeat 12/13/2021:  Stable osteopenia with low FRAX. Repeat BMD 12/07/2023:   5. SOA with exertion, bilateral LE edema, increased fatigue. Patient did have anthracycline chemotherapy which increases lifetime risk of CHF. Offered to schedule ECHO at [facility] - patient lives in [place]. Per patient preference, she will discuss evaluation of heart function with PCP locally. If she needs cardiologist for further intervention/treatment she will alert our office.   6. Reviewed signs to watch for that could indicate a local or distant recurrence. Patient will call our office with any new signs.   Local recurrence  Signs and symptoms of local recurrence following mastectomy/reconstruction may include:   One or more painless nodules on or under the skin of your chest wall   A new area of thickening along or near the mastectomy scar  Regional recurrence   A regional breast cancer recurrence means the cancer has come back in the lymph nodes in your armpit or collarbone area. Signs and symptoms of regional recurrence may include:   A lump or swelling in the lymph nodes under your arm or in the groove above your collarbone   Swelling of your arm (this could be related  to lymphedema or even a blood clot)  Persistent pain in your arm and shoulder   Increasing loss of sensation in your arm and hand  Distant (metastatic) recurrence   A distant, or metastatic, recurrence means the cancer has traveled to distant parts of the body, most commonly the bones, liver and lungs. The signs and symptoms may include:   Pain, such as chest or bone pain   Persistent, dry cough Difficulty breathing   Loss of appetite   Persistent nausea, vomiting or weight loss   Swelling in the abdomen  Severe headaches  When to call our office   You know your body best -- what feels normal and what doesn't. It's important to be aware of the signs and symptoms of recurrent breast cancer, such as:   New and persistent pain   Changes or new lumps in your breast or surgical scar or chest wall   Weight loss   Shortness of breath  If you experience any signs and symptoms that might suggest a recurrence, call our office. Patient verbalized understanding and phone numbers provided.    7. History of glaucoma. Dr. Alfonso Jeannetta Bobo [place], [place]).  8. RTC 12 months with lab and left screening mammogram.      Total Time Today was 40 minutes in the following activities: Preparing to see the patient, Obtaining and/or reviewing separately obtained history, Performing a medically appropriate examination and/or evaluation, Counseling and educating the patient/family/caregiver, Ordering medications, tests, or procedures, Referring and communication with other health care professionals (when not separately reported), Documenting clinical information in the electronic or other health record, Independently interpreting results (not separately reported) and communicating results to the patient/family/caregiver, and Care coordination (not separately reported)      [name], APRN-BC, CBCN  Nurse Practitioner  Collaborating physician: Arlean Blumenthal, MD  NPI # [id number]    Katheryn KATHEE Pollard, APRN-NP

## 2023-12-07 ENCOUNTER — Encounter: Admit: 2023-12-07 | Discharge: 2023-12-07 | Payer: MEDICARE

## 2023-12-07 VITALS — BP 114/66 | HR 92 | Temp 98.10000°F | Resp 16 | Ht 62.205 in | Wt 193.4 lb

## 2023-12-07 DIAGNOSIS — Z9221 Personal history of antineoplastic chemotherapy: Secondary | ICD-10-CM

## 2023-12-07 DIAGNOSIS — Z1231 Encounter for screening mammogram for malignant neoplasm of breast: Secondary | ICD-10-CM

## 2023-12-07 DIAGNOSIS — Z79899 Other long term (current) drug therapy: Secondary | ICD-10-CM

## 2023-12-07 DIAGNOSIS — Z08 Encounter for follow-up examination after completed treatment for malignant neoplasm: Principal | ICD-10-CM

## 2023-12-07 NOTE — Patient Instructions
 Tiffany Burton,     Thank you for coming to see us  today.   Please call our office or send a message through MyChart if you have any questions or concerns.  When calling please leave your name (including spelling), date of birth, phone number where you can be  reached and a description of why you are calling. We will return your call as soon as possible.   Sometimes the clinic can get very busy and we might not be able to get to your phone call until later in the day.    Lab/Imaging Results: Due to the CARES act, as of April 1st, 2021; results automatically release to MyChart.   [name], APRN will continue to send you a result note on anything that she orders.   With these changes you may see your results before she does.   Critical lab results will be addressed immediately.   Please allow up to 72 hours for [name], APRN to review and respond to your results before reaching out with any questions.                                                                                                                                                             Ronal Pouch RN, BSN, OCN, CBCN, CN-BN  Clinical Nurse Coordinator for [name], APRN-BC  [email or web address]         Breast Cancer Prevention and Survivorship Clinic   [phone] (nurse phone)  [phone] (URGENT NEED or After-Hours Line - Answered 24/7)  [phone] (fax)      [facility]   Clinic: Monday, Thursday, Friday   Admin: Tuesday  [address]  [place], [place] [address]  Scheduling: [phone]  Fax: [phone]   [facility]: Wednesday  [address]  [place], [place] [address]  Scheduling: [phone]                         [phone]   Fax: [phone] or [phone]

## 2024-01-29 ENCOUNTER — Encounter: Admit: 2024-01-29 | Discharge: 2024-01-29 | Payer: MEDICARE

## 2024-01-29 ENCOUNTER — Ambulatory Visit: Admit: 2024-01-29 | Discharge: 2024-01-29 | Payer: MEDICARE

## 2024-02-12 ENCOUNTER — Encounter: Admit: 2024-02-12 | Discharge: 2024-02-12 | Payer: MEDICARE

## 2024-02-12 NOTE — Telephone Encounter [36]
 External records pending, documenting communication for medical records to attach records to chart. Please allow 24-48 hours for records if pending for navigation.

## 2024-02-16 ENCOUNTER — Encounter: Admit: 2024-02-16 | Discharge: 2024-02-16 | Payer: MEDICARE

## 2024-04-23 ENCOUNTER — Encounter: Admit: 2024-04-23 | Discharge: 2024-04-23 | Payer: MEDICARE

## 2024-04-23 DIAGNOSIS — J301 Allergic rhinitis due to pollen: Secondary | ICD-10-CM

## 2024-04-23 DIAGNOSIS — K5790 Diverticulosis of intestine, part unspecified, without perforation or abscess without bleeding: Principal | ICD-10-CM

## 2024-04-23 NOTE — Progress Notes [1]
 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     RECENT EVENTS/SYMPTOMS:   12/07/23 Oncology Office Visit - ?SOA with exertion, bilateral LE edema, increased fatigue. Patient did have anthracycline chemotherapy which increases lifetime risk of CHF. Offered to schedule ECHO at [facility] - [place] lives in [place]. Per patient preference, she will discuss evaluation of heart function with PCP locally. If she needs cardiologist for further intervention/treatment she will alert our office.? Progress Notes by Earleen Katheryn NOVAK, APRN-NP (12/07/2023 15:00)     01/23/24 PCP Office Visit - ?She saw NP at [facility] and has some concerns with swelling in the lower left leg and the provider had asked to do an echo but gave her the option where she wants this done at and she wants to go through PCP and get this done here.? Echo ordered for shortness of breath with lower extremity edema and history of chemo that increases the risk for CHF. Outside Family Medicine (01/23/2024)     No Prior Cardiac Care Found     PERTINENT CARDIAC HISTORY: aortic valve regurgitation, hypertension, dyslipidemia, DVT.    OTHER MEDICAL HISTORY: breast cancer s/p right mastectomy & chemotherapy, depression, anxiety, diverticulosis, GERD, glaucoma.     MOST RECENT PERTINENT TESTING/PROCEDURES:   01/29/24 Echo - 2D + DOPPLER ECHO (01/29/2024 16:15)     05/10/16 MPI Stress Test - [facility] MPI STRESS TEST (05/10/2016 14:38)     PERTINENT CARDIAC FAMILY HISTORY:   Mother - CAD, MI  Paternal grandmother - aneurysm  Maternal grandmother - MI  Father - hypertension  Brother - hypertension     MEDICATIONS/ALLERGIES: Up to date in chart (oncologist internal, verified with 01/23/24 PCP OV note).     LAB: Entered/bookmarked in chart. LDL - 64 (01/23/24).     RECORDS: Bookmarked in chart.     IMAGES: None requested.    Chart bookmarked by Corean RAMAN., RN.

## 2024-04-30 ENCOUNTER — Encounter: Admit: 2024-04-30 | Discharge: 2024-04-30 | Payer: MEDICARE
